# Patient Record
Sex: Male | Born: 1965 | Race: White | Hispanic: No | Marital: Married | Smoking: Former smoker
Health system: Southern US, Community
[De-identification: ages and names within clinical notes are randomized; demographics above are authoritative.]

## PROBLEM LIST (undated history)

## (undated) DIAGNOSIS — F528 Other sexual dysfunction not due to a substance or known physiological condition: Secondary | ICD-10-CM

## (undated) DIAGNOSIS — E785 Hyperlipidemia, unspecified: Secondary | ICD-10-CM

## (undated) DIAGNOSIS — R7302 Impaired glucose tolerance (oral): Secondary | ICD-10-CM

## (undated) DIAGNOSIS — R030 Elevated blood-pressure reading, without diagnosis of hypertension: Secondary | ICD-10-CM

## (undated) DIAGNOSIS — J309 Allergic rhinitis, unspecified: Secondary | ICD-10-CM

## (undated) HISTORY — DX: Other sexual dysfunction not due to a substance or known physiological condition: F52.8

## (undated) HISTORY — DX: Impaired glucose tolerance (oral): R73.02

## (undated) HISTORY — DX: Elevated blood-pressure reading, without diagnosis of hypertension: R03.0

## (undated) HISTORY — DX: Allergic rhinitis, unspecified: J30.9

## (undated) HISTORY — DX: Hyperlipidemia, unspecified: E78.5

---
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
## 2003-09-18 ENCOUNTER — Emergency Department (HOSPITAL_COMMUNITY): Admission: EM | Admit: 2003-09-18 | Discharge: 2003-09-18 | Payer: Self-pay | Admitting: Family Medicine

## 2008-05-07 ENCOUNTER — Encounter (INDEPENDENT_AMBULATORY_CARE_PROVIDER_SITE_OTHER): Payer: Self-pay | Admitting: *Deleted

## 2008-05-26 ENCOUNTER — Ambulatory Visit: Payer: Self-pay | Admitting: Internal Medicine

## 2008-05-26 DIAGNOSIS — J309 Allergic rhinitis, unspecified: Secondary | ICD-10-CM | POA: Insufficient documentation

## 2008-05-26 DIAGNOSIS — F528 Other sexual dysfunction not due to a substance or known physiological condition: Secondary | ICD-10-CM

## 2008-05-26 DIAGNOSIS — R030 Elevated blood-pressure reading, without diagnosis of hypertension: Secondary | ICD-10-CM

## 2008-05-26 DIAGNOSIS — E785 Hyperlipidemia, unspecified: Secondary | ICD-10-CM | POA: Insufficient documentation

## 2008-05-26 HISTORY — DX: Allergic rhinitis, unspecified: J30.9

## 2008-05-26 HISTORY — DX: Other sexual dysfunction not due to a substance or known physiological condition: F52.8

## 2008-05-26 HISTORY — DX: Hyperlipidemia, unspecified: E78.5

## 2008-05-26 HISTORY — DX: Elevated blood-pressure reading, without diagnosis of hypertension: R03.0

## 2009-12-22 ENCOUNTER — Ambulatory Visit: Payer: Self-pay | Admitting: Internal Medicine

## 2010-05-31 ENCOUNTER — Telehealth (INDEPENDENT_AMBULATORY_CARE_PROVIDER_SITE_OTHER): Payer: Self-pay | Admitting: *Deleted

## 2010-12-05 LAB — CONVERTED CEMR LAB
ALT: 75 units/L — ABNORMAL HIGH (ref 0–53)
ALT: 84 units/L — ABNORMAL HIGH (ref 0–53)
AST: 37 units/L (ref 0–37)
AST: 46 units/L — ABNORMAL HIGH (ref 0–37)
Albumin: 4.6 g/dL (ref 3.5–5.2)
Albumin: 4.7 g/dL (ref 3.5–5.2)
Alkaline Phosphatase: 44 units/L (ref 39–117)
Alkaline Phosphatase: 54 units/L (ref 39–117)
BUN: 10 mg/dL (ref 6–23)
BUN: 14 mg/dL (ref 6–23)
Basophils Absolute: 0 10*3/uL (ref 0.0–0.1)
Basophils Absolute: 0.1 10*3/uL (ref 0.0–0.1)
Basophils Relative: 0.3 % (ref 0.0–3.0)
Basophils Relative: 1.2 % (ref 0.0–3.0)
Bilirubin Urine: NEGATIVE
Bilirubin Urine: NEGATIVE
Bilirubin, Direct: 0.1 mg/dL (ref 0.0–0.3)
Bilirubin, Direct: 0.2 mg/dL (ref 0.0–0.3)
CO2: 31 meq/L (ref 19–32)
CO2: 31 meq/L (ref 19–32)
Calcium: 10 mg/dL (ref 8.4–10.5)
Calcium: 9.4 mg/dL (ref 8.4–10.5)
Chloride: 104 meq/L (ref 96–112)
Chloride: 105 meq/L (ref 96–112)
Cholesterol: 248 mg/dL (ref 0–200)
Cholesterol: 248 mg/dL — ABNORMAL HIGH (ref 0–200)
Creatinine, Ser: 1.1 mg/dL (ref 0.4–1.5)
Creatinine, Ser: 1.1 mg/dL (ref 0.4–1.5)
Direct LDL: 148.2 mg/dL
Direct LDL: 164.2 mg/dL
Eosinophils Absolute: 0.4 10*3/uL (ref 0.0–0.7)
Eosinophils Absolute: 0.5 10*3/uL (ref 0.0–0.7)
Eosinophils Relative: 3.6 % (ref 0.0–5.0)
Eosinophils Relative: 5.7 % — ABNORMAL HIGH (ref 0.0–5.0)
GFR calc Af Amer: 95 mL/min
GFR calc non Af Amer: 77.77 mL/min (ref >60)
GFR calc non Af Amer: 78 mL/min
Glucose, Bld: 110 mg/dL — ABNORMAL HIGH (ref 70–99)
Glucose, Bld: 110 mg/dL — ABNORMAL HIGH (ref 70–99)
HCT: 48.4 % (ref 39.0–52.0)
HCT: 48.6 % (ref 39.0–52.0)
HDL: 33.9 mg/dL — ABNORMAL LOW (ref >39.0)
HDL: 41.7 mg/dL (ref >39.00)
Hemoglobin, Urine: NEGATIVE
Hemoglobin, Urine: NEGATIVE
Hemoglobin: 16.8 g/dL (ref 13.0–17.0)
Hemoglobin: 17 g/dL (ref 13.0–17.0)
Ketones, ur: NEGATIVE mg/dL
Ketones, ur: NEGATIVE mg/dL
Leukocytes, UA: NEGATIVE
Leukocytes, UA: NEGATIVE
Lymphocytes Relative: 32.8 % (ref 12.0–46.0)
Lymphocytes Relative: 34.2 % (ref 12.0–46.0)
Lymphs Abs: 3.4 10*3/uL (ref 0.7–4.0)
MCHC: 34.5 g/dL (ref 30.0–36.0)
MCHC: 35 g/dL (ref 30.0–36.0)
MCV: 92.1 fL (ref 78.0–100.0)
MCV: 92.9 fL (ref 78.0–100.0)
Monocytes Absolute: 0.6 10*3/uL (ref 0.1–1.0)
Monocytes Absolute: 0.8 10*3/uL (ref 0.1–1.0)
Monocytes Relative: 7.1 % (ref 3.0–12.0)
Monocytes Relative: 8.1 % (ref 3.0–12.0)
Neutro Abs: 4.4 10*3/uL (ref 1.4–7.7)
Neutro Abs: 5.1 10*3/uL (ref 1.4–7.7)
Neutrophils Relative %: 52.9 % (ref 43.0–77.0)
Neutrophils Relative %: 54.1 % (ref 43.0–77.0)
Nitrite: NEGATIVE
Nitrite: NEGATIVE
PSA: 2.61 ng/mL (ref 0.10–4.00)
PSA: 2.87 ng/mL (ref 0.10–4.00)
Platelets: 241 10*3/uL (ref 150–400)
Platelets: 265 10*3/uL (ref 150.0–400.0)
Potassium: 3.8 meq/L (ref 3.5–5.1)
Potassium: 4.4 meq/L (ref 3.5–5.1)
RBC: 5.21 M/uL (ref 4.22–5.81)
RBC: 5.28 M/uL (ref 4.22–5.81)
RDW: 13.3 % (ref 11.5–14.6)
RDW: 13.9 % (ref 11.5–14.6)
Sodium: 140 meq/L (ref 135–145)
Sodium: 142 meq/L (ref 135–145)
Specific Gravity, Urine: 1.01 (ref 1.000–1.030)
Specific Gravity, Urine: 1.015 (ref 1.000–1.03)
TSH: 1.66 microintl units/mL (ref 0.35–5.50)
TSH: 1.78 microintl units/mL (ref 0.35–5.50)
Testosterone: 282.11 ng/dL — ABNORMAL LOW (ref 350.00–890)
Total Bilirubin: 1 mg/dL (ref 0.3–1.2)
Total Bilirubin: 1.5 mg/dL — ABNORMAL HIGH (ref 0.3–1.2)
Total CHOL/HDL Ratio: 6
Total CHOL/HDL Ratio: 7.3
Total Protein, Urine: NEGATIVE mg/dL
Total Protein: 7.3 g/dL (ref 6.0–8.3)
Total Protein: 7.5 g/dL (ref 6.0–8.3)
Triglycerides: 327 mg/dL (ref 0–149)
Triglycerides: 372 mg/dL — ABNORMAL HIGH (ref 0.0–149.0)
Urine Glucose: NEGATIVE mg/dL
Urine Glucose: NEGATIVE mg/dL
Urobilinogen, UA: 0.2 (ref 0.0–1.0)
Urobilinogen, UA: 0.2 (ref 0.0–1.0)
VLDL: 65 mg/dL — ABNORMAL HIGH (ref 0–40)
VLDL: 74.4 mg/dL — ABNORMAL HIGH (ref 0.0–40.0)
WBC: 8.2 10*3/uL (ref 4.5–10.5)
WBC: 9.8 10*3/uL (ref 4.5–10.5)
pH: 7 (ref 5.0–8.0)
pH: 8.5 (ref 5.0–8.0)

## 2010-12-07 NOTE — Progress Notes (Signed)
  Phone Note Other Incoming   Request: Send information Summary of Call: Request for records received from  the [facility] office. Request forwarded to [facility].

## 2010-12-07 NOTE — Assessment & Plan Note (Signed)
Summary: BP IS HIGH/NWS  #   Vital Signs:  Patient profile:   45 year old male Height:      74 inches Weight:      231 pounds BMI:     29.77 O2 Sat:      97 % on Room air Temp:     98 degrees F oral Pulse rate:   79 / minute BP sitting:   132 / 92  (left arm) Cuff size:   large  Vitals Entered ByMarland Kitchen Zella Ball Ewing (December 22, 2009 9:59 AM)  O2 Flow:  Room air CC: Blood Pressure High/RE   CC:  Blood Pressure High/RE.  History of Present Illness: overall wt prob stable more or less for the last few yrs;  recent tx at urgent care for bonrhcitis with zpack and narcotic cough med, overall better; BP this past wk has been in the 150'2 sbp, and 110-120' for diast, feels overall some better today, BP down this am with persistent midl cough only and low energy and early to bed the the few days;  has BP cuff at work.  Pt denies CP, sob, doe, wheezing, orthopnea, pnd, worsening LE edema, palps, dizziness or syncope .  Pt denies new neuro symptoms such as headache, facial or extremity weakness  Pt denies polydipsia, polyuria, or low sugar symptoms such as shakiness improved with eating.  Overall good compliance with meds, trying to start low chol diet soon with the food; , wt stable, little excercise however   Problems Prior to Update: 1)  Preventive Health Care  (ICD-V70.0) 2)  Elevated Blood Pressure Without Diagnosis of Hypertension  (ICD-796.2) 3)  Erectile Dysfunction  (ICD-302.72) 4)  Preventive Health Care  (ICD-V70.0) 5)  Hyperlipidemia  (ICD-272.4) 6)  Allergic Rhinitis  (ICD-477.9)  Medications Prior to Update: 1)  Viagra 100 Mg  Tabs (Sildenafil Citrate) .Marland Kitchen.. 1 By Mouth Once Daily As Needed  Current Medications (verified): 1)  Viagra 100 Mg  Tabs (Sildenafil Citrate) .Marland Kitchen.. 1 By Mouth Once Daily As Needed  Allergies (verified): No Known Drug Allergies  Past History:  Past Medical History: Last updated: 05/26/2008 Allergic rhinitis E.D. Hyperlipidemia  Past Surgical  History: Last updated: 05/26/2008 s/p facial cyst 1984  Family History: Last updated: 05/26/2008 grandfather iwth MI and stroke at 56 yo father with CAD/CABG at 11 yo, DM  Social History: Last updated: 12/22/2009 moved to [place] and back Married 3 children work - Health visitor for [employer] Current Smoker - 4 cigar per year Alcohol use-yes - social  Risk Factors: Smoking Status: current (05/26/2008)  Family History: Reviewed history from 05/26/2008 and no changes required. grandfather iwth MI and stroke at 59 yo father with CAD/CABG at 69 yo, DM  Social History: Reviewed history from 05/26/2008 and no changes required. 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         all otherwise negative per pt -  Physical Exam  General:  alert and overweight-appearing.   Head:  normocephalic and  atraumatic.   Eyes:  vision grossly intact, pupils equal, and pupils round.   Ears:  R ear normal and L ear normal.   Nose:  no external deformity and no nasal discharge.   Mouth:  no gingival abnormalities and pharynx pink and moist.   Neck:  supple and no masses.   Lungs:  normal respiratory effort and normal breath sounds.   Heart:  normal rate and regular rhythm.   Abdomen:  soft, non-tender, and normal bowel sounds.   Msk:  no joint tenderness and no joint  swelling.   Extremities:  no edema, no erythema  Neurologic:  cranial nerves II-XII intact and strength normal in all extremities.     Impression & Recommendations:  Problem # 1:  Preventive Health Care (ICD-V70.0) Overall doing well, age appropriate education and counseling updated and referral for appropriate preventive services done unless declined, immunizations up to date or declined, diet counseling done if overweight, urged to quit smoking if smokes , most recent labs reviewed and current ordered if appropriate, ecg reviewed or declined (interpretation per ECG scanned in the EMR if done); information regarding Medicare Prevention requirements given if appropriate  Orders: EKG w/ Interpretation (93000) TLB-BMP (Basic Metabolic Panel-BMET) (80048-METABOL) TLB-CBC Platelet - w/Differential (85025-CBCD) TLB-Hepatic/Liver Function Pnl (80076-HEPATIC) TLB-Lipid Panel (80061-LIPID) TLB-TSH (Thyroid Stimulating Hormone) (84443-TSH) TLB-PSA (Prostate Specific Antigen) (84153-PSA) TLB-Udip ONLY (81003-UDIP)  Problem # 2:  ELEVATED BLOOD PRESSURE WITHOUT DIAGNOSIS OF HYPERTENSION (ICD-796.2) to cont to monitor, possible pre-HTN, and recent elev situational related to recent bronchitis; to cont monitor at work and call wtih BP in 3 wks  Complete Medication List: 1)  Viagra 100 Mg Tabs (Sildenafil citrate) .Marland Kitchen.. 1 by mouth once daily as needed  Patient Instructions: 1)  Please go to the Lab in the basement for your blood and/or urine tests today 2)  Your EKG was good today 3)  Continue all previous medications as before this visit  4)  Check your Blood Pressure regularly, such as 1-3 times per day for the next 2 -3 wks.  . If it is above 140/90, you should call with the results. 5)  Please schedule a follow-up appointment in 1 year or sooner if needed

## 2011-02-23 ENCOUNTER — Other Ambulatory Visit: Payer: Self-pay | Admitting: Internal Medicine

## 2011-05-27 ENCOUNTER — Other Ambulatory Visit: Payer: Self-pay | Admitting: Internal Medicine

## 2011-07-02 ENCOUNTER — Other Ambulatory Visit: Payer: Self-pay | Admitting: Internal Medicine

## 2011-07-05 ENCOUNTER — Other Ambulatory Visit (INDEPENDENT_AMBULATORY_CARE_PROVIDER_SITE_OTHER): Payer: BC Managed Care – PPO

## 2011-07-05 ENCOUNTER — Other Ambulatory Visit: Payer: Self-pay | Admitting: Internal Medicine

## 2011-07-05 ENCOUNTER — Telehealth: Payer: Self-pay

## 2011-07-05 DIAGNOSIS — Z1289 Encounter for screening for malignant neoplasm of other sites: Secondary | ICD-10-CM

## 2011-07-05 DIAGNOSIS — Z0000 Encounter for general adult medical examination without abnormal findings: Secondary | ICD-10-CM

## 2011-07-05 LAB — URINALYSIS, ROUTINE W REFLEX MICROSCOPIC
Bilirubin Urine: NEGATIVE
Hgb urine dipstick: NEGATIVE
Nitrite: NEGATIVE
Specific Gravity, Urine: 1.01 (ref 1.000–1.030)
Total Protein, Urine: NEGATIVE
Urine Glucose: NEGATIVE
Urobilinogen, UA: 0.2 (ref 0.0–1.0)
pH: 7 (ref 5.0–8.0)

## 2011-07-05 LAB — BASIC METABOLIC PANEL
BUN: 20 mg/dL (ref 6–23)
CO2: 29 mEq/L (ref 19–32)
Calcium: 9.7 mg/dL (ref 8.4–10.5)
Chloride: 101 mEq/L (ref 96–112)
Creatinine, Ser: 1.2 mg/dL (ref 0.4–1.5)
GFR: 72.63 mL/min (ref >60.00)
Glucose, Bld: 107 mg/dL — ABNORMAL HIGH (ref 70–99)
Potassium: 4.3 mEq/L (ref 3.5–5.1)
Sodium: 139 mEq/L (ref 135–145)

## 2011-07-05 LAB — CBC WITH DIFFERENTIAL/PLATELET
Basophils Absolute: 0 10*3/uL (ref 0.0–0.1)
Basophils Relative: 0.3 % (ref 0.0–3.0)
Eosinophils Absolute: 0.3 10*3/uL (ref 0.0–0.7)
Eosinophils Relative: 2.6 % (ref 0.0–5.0)
HCT: 48.4 % (ref 39.0–52.0)
Hemoglobin: 16.5 g/dL (ref 13.0–17.0)
Lymphocytes Relative: 30.4 % (ref 12.0–46.0)
Lymphs Abs: 3.1 10*3/uL (ref 0.7–4.0)
MCHC: 34 g/dL (ref 30.0–36.0)
MCV: 93.7 fl (ref 78.0–100.0)
Monocytes Absolute: 0.8 10*3/uL (ref 0.1–1.0)
Monocytes Relative: 7.6 % (ref 3.0–12.0)
Neutro Abs: 6.1 10*3/uL (ref 1.4–7.7)
Neutrophils Relative %: 59.1 % (ref 43.0–77.0)
Platelets: 304 10*3/uL (ref 150.0–400.0)
RBC: 5.16 Mil/uL (ref 4.22–5.81)
RDW: 14.3 % (ref 11.5–14.6)
WBC: 10.3 10*3/uL (ref 4.5–10.5)

## 2011-07-05 LAB — HEPATIC FUNCTION PANEL
ALT: 41 U/L (ref 0–53)
AST: 85 U/L — ABNORMAL HIGH (ref 0–37)
Albumin: 4.9 g/dL (ref 3.5–5.2)
Alkaline Phosphatase: 69 U/L (ref 39–117)
Bilirubin, Direct: 0.2 mg/dL (ref 0.0–0.3)
Total Bilirubin: 1.5 mg/dL — ABNORMAL HIGH (ref 0.3–1.2)
Total Protein: 7.4 g/dL (ref 6.0–8.3)

## 2011-07-05 LAB — LIPID PANEL
Cholesterol: 215 mg/dL — ABNORMAL HIGH (ref 0–200)
HDL: 44.8 mg/dL (ref >39.00)
Total CHOL/HDL Ratio: 5
Triglycerides: 229 mg/dL — ABNORMAL HIGH (ref 0.0–149.0)
VLDL: 45.8 mg/dL — ABNORMAL HIGH (ref 0.0–40.0)

## 2011-07-05 LAB — PSA: PSA: 2.89 ng/mL (ref 0.10–4.00)

## 2011-07-05 LAB — TSH: TSH: 1.75 u[IU]/mL (ref 0.35–5.50)

## 2011-07-05 NOTE — Telephone Encounter (Signed)
Put lab order in for this patients physical

## 2011-07-06 ENCOUNTER — Ambulatory Visit: Payer: BC Managed Care – PPO

## 2011-07-06 LAB — LDL CHOLESTEROL, DIRECT: Direct LDL: 140 mg/dL

## 2011-07-07 LAB — HEPATITIS PANEL, ACUTE
HCV Ab: NEGATIVE
Hep A IgM: NEGATIVE
Hep B C IgM: NEGATIVE
Hepatitis B Surface Ag: NEGATIVE

## 2011-07-08 ENCOUNTER — Other Ambulatory Visit: Payer: Self-pay | Admitting: Internal Medicine

## 2011-07-10 ENCOUNTER — Encounter: Payer: Self-pay | Admitting: Internal Medicine

## 2011-07-10 DIAGNOSIS — Z0000 Encounter for general adult medical examination without abnormal findings: Secondary | ICD-10-CM | POA: Insufficient documentation

## 2011-07-12 ENCOUNTER — Encounter: Payer: Self-pay | Admitting: Internal Medicine

## 2011-07-12 ENCOUNTER — Ambulatory Visit (INDEPENDENT_AMBULATORY_CARE_PROVIDER_SITE_OTHER): Payer: BC Managed Care – PPO | Admitting: Internal Medicine

## 2011-07-12 VITALS — BP 120/82 | HR 68 | Temp 98.5°F | Ht 73.0 in | Wt 219.5 lb

## 2011-07-12 DIAGNOSIS — Z23 Encounter for immunization: Secondary | ICD-10-CM

## 2011-07-12 DIAGNOSIS — Z0000 Encounter for general adult medical examination without abnormal findings: Secondary | ICD-10-CM

## 2011-07-12 DIAGNOSIS — E785 Hyperlipidemia, unspecified: Secondary | ICD-10-CM

## 2011-07-12 MED ORDER — SILDENAFIL CITRATE 100 MG PO TABS: 100.0000 mg | ORAL_TABLET | Freq: Every day | ORAL | Status: DC | PRN

## 2011-07-12 MED ORDER — TETANUS-DIPHTH-ACELL PERTUSSIS 5-2.5-18.5 LF-MCG/0.5 IM SUSP: 0.5000 mL | Freq: Once | INTRAMUSCULAR | Status: DC

## 2011-07-12 NOTE — Patient Instructions (Signed)
Continue all other medications as before; refill was sent to the pharmacy Please continue your excellent attention to better diet and exercise You had the tetanus update today (due next in 10 yrs) Please return in 1 year for your yearly visit, or sooner if needed, with Lab testing done 3-5 days before

## 2011-07-12 NOTE — Progress Notes (Signed)
Subjective:    Patient ID: Steven Mcclain, male    DOB: [birth year], 45 y.o.   MRN: [id number]  HPI  Here for wellness and f/u;  Overall doing ok;  Pt denies CP, worsening SOB, DOE, wheezing, orthopnea, PND, worsening LE edema, palpitations, dizziness or syncope.  Pt denies neurological change such as new Headache, facial or extremity weakness.  Pt denies polydipsia, polyuria, or low sugar symptoms. Pt states overall good compliance with treatment and medications, good tolerability, and trying to follow lower cholesterol diet.  Pt denies worsening depressive symptoms, suicidal ideation or panic. No fever, wt loss, night sweats, loss of appetite, or other constitutional symptoms.  Pt states good ability with ADL's, low fall risk, home safety reviewed and adequate, no significant changes in hearing or vision, and more active with exercise.  Has lost overall about 25 lbs per pt in the past yr. Past Medical History  Diagnosis Date  . ALLERGIC RHINITIS 05/26/2008  . ELEVATED BLOOD PRESSURE WITHOUT DIAGNOSIS OF HYPERTENSION 05/26/2008  . ERECTILE DYSFUNCTION 05/26/2008  . HYPERLIPIDEMIA 05/26/2008   Past Surgical History  Procedure Date  . S/p facial cyst 1984    reports that he has been smoking Cigars.  He does not have any smokeless tobacco history on file. He reports that he drinks alcohol. His drug history not on file. family history includes Coronary artery disease in his father and Diabetes in his father. No Known Allergies Current Outpatient Prescriptions on File Prior to Visit  Medication Sig Dispense Refill  . VIAGRA 100 MG tablet TAKE 1 TABLET EVERY DAY AS NEEDED  5 tablet  0   No current facility-administered medications on file prior to visit.   Review of Systems Review of Systems  Constitutional: Negative for diaphoresis, activity change, appetite change and unexpected weight change.  HENT: Negative for hearing loss, ear pain, facial swelling, mouth sores and neck stiffness.   Eyes:  Negative for pain, redness and visual disturbance.  Respiratory: Negative for shortness of breath and wheezing.   Cardiovascular: Negative for chest pain and palpitations.  Gastrointestinal: Negative for diarrhea, blood in stool, abdominal distention and rectal pain.  Genitourinary: Negative for hematuria, flank pain and decreased urine volume.  Musculoskeletal: Negative for myalgias and joint swelling.  Skin: Negative for color change and wound.  Neurological: Negative for syncope and numbness.  Hematological: Negative for adenopathy.  Psychiatric/Behavioral: Negative for hallucinations, self-injury, decreased concentration and agitation.      Objective:   Physical Exam BP 120/82  Pulse 68  Temp(Src) 98.5 F (36.9 C) (Oral)  Ht 6\' 1"  (1.854 m)  Wt 219 lb 8 oz (99.565 kg)  BMI 28.96 kg/m2  SpO2 97% Physical Exam  VS noted Constitutional: Pt is oriented to person, place, and time. Appears well-developed and well-nourished.  HENT:  Head: Normocephalic and atraumatic.  Right Ear: External ear normal.  Left Ear: External ear normal.  Nose: Nose normal.  Mouth/Throat: Oropharynx is clear and moist.  Eyes: Conjunctivae and EOM are normal. Pupils are equal, round, and reactive to light.  Neck: Normal range of motion. Neck supple. No JVD present. No tracheal deviation present.  Cardiovascular: Normal rate, regular rhythm, normal heart sounds and intact distal pulses.   Pulmonary/Chest: Effort normal and breath sounds normal.  Abdominal: Soft. Bowel sounds are normal. There is no tenderness.  Musculoskeletal: Normal range of motion. Exhibits no edema.  Lymphadenopathy:  Has no cervical adenopathy.  Neurological: Pt is alert and oriented to person, place, and time. Pt has  normal reflexes. No cranial nerve deficit.  Skin: Skin is warm and dry. No rash noted.  Psychiatric:  Has  normal mood and affect. Behavior is normal.     Assessment & Plan:

## 2011-07-12 NOTE — Assessment & Plan Note (Signed)
LDL 140  Nice improved in the past  2 yrs with diet , exercise;  For cont'd diet for now, consider statin next yr if not improved

## 2011-07-12 NOTE — Assessment & Plan Note (Signed)

## 2011-12-04 ENCOUNTER — Ambulatory Visit (INDEPENDENT_AMBULATORY_CARE_PROVIDER_SITE_OTHER): Payer: BC Managed Care – PPO

## 2011-12-04 DIAGNOSIS — J019 Acute sinusitis, unspecified: Secondary | ICD-10-CM

## 2012-07-06 ENCOUNTER — Other Ambulatory Visit (INDEPENDENT_AMBULATORY_CARE_PROVIDER_SITE_OTHER): Payer: BC Managed Care – PPO

## 2012-07-06 DIAGNOSIS — Z0000 Encounter for general adult medical examination without abnormal findings: Secondary | ICD-10-CM

## 2012-07-06 LAB — HEPATIC FUNCTION PANEL
ALT: 34 U/L (ref 0–53)
AST: 23 U/L (ref 0–37)
Albumin: 4.5 g/dL (ref 3.5–5.2)
Alkaline Phosphatase: 49 U/L (ref 39–117)
Bilirubin, Direct: 0.2 mg/dL (ref 0.0–0.3)
Total Bilirubin: 1 mg/dL (ref 0.3–1.2)
Total Protein: 7.3 g/dL (ref 6.0–8.3)

## 2012-07-06 LAB — BASIC METABOLIC PANEL
BUN: 15 mg/dL (ref 6–23)
CO2: 30 mEq/L (ref 19–32)
Calcium: 9.8 mg/dL (ref 8.4–10.5)
Chloride: 103 mEq/L (ref 96–112)
Creatinine, Ser: 1.2 mg/dL (ref 0.4–1.5)
GFR: 71.58 mL/min (ref >60.00)
Glucose, Bld: 101 mg/dL — ABNORMAL HIGH (ref 70–99)
Potassium: 4.5 mEq/L (ref 3.5–5.1)
Sodium: 139 mEq/L (ref 135–145)

## 2012-07-06 LAB — URINALYSIS, ROUTINE W REFLEX MICROSCOPIC
Bilirubin Urine: NEGATIVE
Hgb urine dipstick: NEGATIVE
Ketones, ur: NEGATIVE
Leukocytes, UA: NEGATIVE
Nitrite: NEGATIVE
Specific Gravity, Urine: 1.01 (ref 1.000–1.030)
Total Protein, Urine: NEGATIVE
Urine Glucose: NEGATIVE
Urobilinogen, UA: 0.2 (ref 0.0–1.0)
pH: 7 (ref 5.0–8.0)

## 2012-07-06 LAB — CBC WITH DIFFERENTIAL/PLATELET
Basophils Absolute: 0 10*3/uL (ref 0.0–0.1)
Basophils Relative: 0.1 % (ref 0.0–3.0)
Eosinophils Absolute: 0.5 10*3/uL (ref 0.0–0.7)
Eosinophils Relative: 5.3 % — ABNORMAL HIGH (ref 0.0–5.0)
HCT: 47.2 % (ref 39.0–52.0)
Hemoglobin: 16 g/dL (ref 13.0–17.0)
Lymphocytes Relative: 35.3 % (ref 12.0–46.0)
Lymphs Abs: 3.3 10*3/uL (ref 0.7–4.0)
MCHC: 33.9 g/dL (ref 30.0–36.0)
MCV: 92.8 fl (ref 78.0–100.0)
Monocytes Absolute: 0.8 10*3/uL (ref 0.1–1.0)
Monocytes Relative: 8.1 % (ref 3.0–12.0)
Neutro Abs: 4.8 10*3/uL (ref 1.4–7.7)
Neutrophils Relative %: 51.2 % (ref 43.0–77.0)
Platelets: 257 10*3/uL (ref 150.0–400.0)
RBC: 5.08 Mil/uL (ref 4.22–5.81)
RDW: 13.9 % (ref 11.5–14.6)
WBC: 9.3 10*3/uL (ref 4.5–10.5)

## 2012-07-06 LAB — LIPID PANEL
Cholesterol: 231 mg/dL — ABNORMAL HIGH (ref 0–200)
HDL: 39.5 mg/dL (ref >39.00)
Total CHOL/HDL Ratio: 6
Triglycerides: 273 mg/dL — ABNORMAL HIGH (ref 0.0–149.0)
VLDL: 54.6 mg/dL — ABNORMAL HIGH (ref 0.0–40.0)

## 2012-07-06 LAB — LDL CHOLESTEROL, DIRECT: Direct LDL: 142.6 mg/dL

## 2012-07-06 LAB — PSA: PSA: 2.21 ng/mL (ref 0.10–4.00)

## 2012-07-06 LAB — TSH: TSH: 1.67 u[IU]/mL (ref 0.35–5.50)

## 2012-07-12 ENCOUNTER — Ambulatory Visit (INDEPENDENT_AMBULATORY_CARE_PROVIDER_SITE_OTHER): Payer: BC Managed Care – PPO | Admitting: Internal Medicine

## 2012-07-12 ENCOUNTER — Encounter: Payer: Self-pay | Admitting: Internal Medicine

## 2012-07-12 VITALS — BP 128/88 | HR 80 | Temp 99.1°F | Resp 16 | Wt 224.5 lb

## 2012-07-12 DIAGNOSIS — E785 Hyperlipidemia, unspecified: Secondary | ICD-10-CM

## 2012-07-12 DIAGNOSIS — Z136 Encounter for screening for cardiovascular disorders: Secondary | ICD-10-CM

## 2012-07-12 DIAGNOSIS — Z0000 Encounter for general adult medical examination without abnormal findings: Secondary | ICD-10-CM

## 2012-07-12 MED ORDER — ASPIRIN 81 MG PO TBEC: 81.0000 mg | DELAYED_RELEASE_TABLET | Freq: Every day | ORAL | Status: AC

## 2012-07-12 MED ORDER — SILDENAFIL CITRATE 100 MG PO TABS: 100.0000 mg | ORAL_TABLET | ORAL | Status: DC | PRN

## 2012-07-12 NOTE — Patient Instructions (Addendum)
Continue all other medications as before Please start Aspirin 81 mg -1 per day - Enteric Coated only Please continue your efforts at being more active, low cholesterol diet, and weight control. You are otherwise up to date with prevention Please return in 1 year for your yearly visit, or sooner if needed, with Lab testing done 3-5 days before

## 2012-07-12 NOTE — Progress Notes (Signed)
Subjective:    Patient ID: Steven Mcclain, male    DOB: February 19, 1966, 46 y.o.   MRN: [id number]  HPI Here for wellness and f/u;  Overall doing ok;  Pt denies CP, worsening SOB, DOE, wheezing, orthopnea, PND, worsening LE edema, palpitations, dizziness or syncope.  Pt denies neurological change such as new Headache, facial or extremity weakness.  Pt denies polydipsia, polyuria, or low sugar symptoms. Pt states overall good compliance with treatment and medications, good tolerability, and trying to follow lower cholesterol diet.  Pt denies worsening depressive symptoms, suicidal ideation or panic. No fever, wt loss, night sweats, loss of appetite, or other constitutional symptoms.  Pt states good ability with ADL's, low fall risk, home safety reviewed and adequate, no significant changes in hearing or vision, and occasionally active with exercise.  No acute complaints.  Needs viagra refill.  Has been somewhat less active and diet not as good in the past 6 months as he has been traveling more for work. Past Medical History  Diagnosis Date  . ALLERGIC RHINITIS 05/26/2008  . ELEVATED BLOOD PRESSURE WITHOUT DIAGNOSIS OF HYPERTENSION 05/26/2008  . ERECTILE DYSFUNCTION 05/26/2008  . HYPERLIPIDEMIA 05/26/2008   Past Surgical History  Procedure Date  . S/p facial cyst 1984    reports that he has been smoking Cigars.  He does not have any smokeless tobacco history on file. He reports that he drinks alcohol. His drug history not on file. family history includes Coronary artery disease in his father and Diabetes in his father. No Known Allergies Current Outpatient Prescriptions on File Prior to Visit  Medication Sig Dispense Refill  . DISCONTD: VIAGRA 100 MG tablet TAKE 1 TABLET EVERY DAY AS NEEDED  5 tablet  0  . sildenafil (VIAGRA) 100 MG tablet Take 1 tablet (100 mg total) by mouth daily as needed for erectile dysfunction.  10 tablet  11   Current Facility-Administered Medications on File Prior to Visit    Medication Dose Route Frequency Provider Last Rate Last Dose  . DISCONTD: TDaP (BOOSTRIX) injection 0.5 mL  0.5 mL Intramuscular Once Corwin Levins, MD       Review of Systems Review of Systems  Constitutional: Negative for diaphoresis, activity change, appetite change and unexpected weight change.  HENT: Negative for hearing loss, ear pain, facial swelling, mouth sores and neck stiffness.   Eyes: Negative for pain, redness and visual disturbance.  Respiratory: Negative for shortness of breath and wheezing.   Cardiovascular: Negative for chest pain and palpitations.  Gastrointestinal: Negative for diarrhea, blood in stool, abdominal distention and rectal pain.  Genitourinary: Negative for hematuria, flank pain and decreased urine volume.  Musculoskeletal: Negative for myalgias and joint swelling.  Skin: Negative for color change and wound.  Neurological: Negative for syncope and numbness.  Hematological: Negative for adenopathy.  Psychiatric/Behavioral: Negative for hallucinations, self-injury, decreased concentration and agitation.      Objective:   Physical Exam BP 128/88  Pulse 80  Temp 99.1 F (37.3 C) (Oral)  Resp 16  Wt 224 lb 8 oz (101.833 kg)  SpO2 95% Physical Exam  VS noted Constitutional: Pt is oriented to person, place, and time. Appears well-developed and well-nourished.  HENT:  Head: Normocephalic and atraumatic.  Right Ear: External ear normal.  Left Ear: External ear normal.  Nose: Nose normal.  Mouth/Throat: Oropharynx is clear and moist.  Eyes: Conjunctivae and EOM are normal. Pupils are equal, round, and reactive to light.  Neck: Normal range of motion. Neck supple.  No JVD present. No tracheal deviation present.  Cardiovascular: Normal rate, regular rhythm, normal heart sounds and intact distal pulses.   Pulmonary/Chest: Effort normal and breath sounds normal.  Abdominal: Soft. Bowel sounds are normal. There is no tenderness.  Musculoskeletal: Normal range  of motion. Exhibits no edema.  Lymphadenopathy:  Has no cervical adenopathy.  Neurological: Pt is alert and oriented to person, place, and time. Pt has normal reflexes. No cranial nerve deficit.  Skin: Skin is warm and dry. No rash noted.  Psychiatric:  Has  normal mood and affect. Behavior is normal.     Assessment & Plan:

## 2012-07-12 NOTE — Assessment & Plan Note (Signed)
D/w pt - declines statin at this time, goal ldl < 100

## 2012-07-12 NOTE — Assessment & Plan Note (Addendum)
Overall doing well, age appropriate education and counseling updated, referrals for preventative services and immunizations addressed, dietary and smoking counseling addressed, most recent labs and ECG reviewed.  I have personally reviewed and have noted: 1) the patient's medical and social history 2) The pt's use of alcohol, tobacco, and illicit drugs 3) The patient's current medications and supplements 4) Functional ability including ADL's, fall risk, home safety risk, hearing and visual impairment 5) Diet and physical activities 6) Evidence for depression or mood disorder 7) The patient's height, weight, and BMI have been recorded in the chart I have made referrals, and provided counseling and education based on review of the above ECG reviewed as per emr, to start asa 81 mg in light of his persistent elev LDL

## 2013-07-16 ENCOUNTER — Encounter: Payer: BC Managed Care – PPO | Admitting: Internal Medicine

## 2013-07-16 DIAGNOSIS — Z0289 Encounter for other administrative examinations: Secondary | ICD-10-CM

## 2014-01-03 ENCOUNTER — Telehealth: Payer: Self-pay

## 2014-01-03 MED ORDER — SILDENAFIL CITRATE 100 MG PO TABS: 100.0000 mg | ORAL_TABLET | ORAL | Status: DC | PRN

## 2014-01-03 NOTE — Telephone Encounter (Signed)
The patient called and is hoping to get refills of his viagra rx until his cpe in June.  Pharmacy- [facility] on [address]

## 2014-04-10 ENCOUNTER — Encounter: Payer: BC Managed Care – PPO | Admitting: Internal Medicine

## 2014-07-01 ENCOUNTER — Encounter: Payer: BC Managed Care – PPO | Admitting: Internal Medicine

## 2014-07-28 ENCOUNTER — Telehealth: Payer: Self-pay | Admitting: Internal Medicine

## 2014-07-28 ENCOUNTER — Other Ambulatory Visit: Payer: BC Managed Care – PPO

## 2014-07-28 DIAGNOSIS — Z125 Encounter for screening for malignant neoplasm of prostate: Secondary | ICD-10-CM

## 2014-07-28 DIAGNOSIS — Z0000 Encounter for general adult medical examination without abnormal findings: Secondary | ICD-10-CM

## 2014-07-28 NOTE — Telephone Encounter (Signed)
Pt has [employer] entered standard cpx labs../lmb 

## 2014-07-28 NOTE — Telephone Encounter (Signed)
Pt came by requesting lab orders to be put in.  Pt has CPE this Fri Sept 25 @ 4:30 pm.  Please advise.

## 2014-07-29 ENCOUNTER — Other Ambulatory Visit (INDEPENDENT_AMBULATORY_CARE_PROVIDER_SITE_OTHER): Payer: BC Managed Care – PPO

## 2014-07-29 DIAGNOSIS — R7989 Other specified abnormal findings of blood chemistry: Secondary | ICD-10-CM

## 2014-07-29 DIAGNOSIS — Z0000 Encounter for general adult medical examination without abnormal findings: Secondary | ICD-10-CM

## 2014-07-29 DIAGNOSIS — Z125 Encounter for screening for malignant neoplasm of prostate: Secondary | ICD-10-CM

## 2014-07-29 LAB — CBC WITH DIFFERENTIAL/PLATELET
Basophils Absolute: 0 10*3/uL (ref 0.0–0.1)
Basophils Relative: 0.4 % (ref 0.0–3.0)
EOS PCT: 6 % — AB (ref 0.0–5.0)
Eosinophils Absolute: 0.5 10*3/uL (ref 0.0–0.7)
HCT: 50.1 % (ref 39.0–52.0)
Hemoglobin: 16.9 g/dL (ref 13.0–17.0)
LYMPHS ABS: 3.5 10*3/uL (ref 0.7–4.0)
Lymphocytes Relative: 38.9 % (ref 12.0–46.0)
MCHC: 33.6 g/dL (ref 30.0–36.0)
MCV: 96.9 fl (ref 78.0–100.0)
MONOS PCT: 8.5 % (ref 3.0–12.0)
Monocytes Absolute: 0.8 10*3/uL (ref 0.1–1.0)
Neutro Abs: 4.1 10*3/uL (ref 1.4–7.7)
Neutrophils Relative %: 46.2 % (ref 43.0–77.0)
PLATELETS: 288 10*3/uL (ref 150.0–400.0)
RBC: 5.17 Mil/uL (ref 4.22–5.81)
RDW: 14.9 % (ref 11.5–15.5)
WBC: 8.9 10*3/uL (ref 4.0–10.5)

## 2014-07-29 LAB — URINALYSIS, ROUTINE W REFLEX MICROSCOPIC
BILIRUBIN URINE: NEGATIVE
Hgb urine dipstick: NEGATIVE
KETONES UR: NEGATIVE
LEUKOCYTES UA: NEGATIVE
Nitrite: NEGATIVE
RBC / HPF: NONE SEEN (ref 0–?)
Specific Gravity, Urine: 1.015 (ref 1.000–1.030)
Total Protein, Urine: NEGATIVE
UROBILINOGEN UA: 0.2 (ref 0.0–1.0)
Urine Glucose: NEGATIVE
WBC, UA: NONE SEEN (ref 0–?)
pH: 7 (ref 5.0–8.0)

## 2014-07-29 LAB — HEPATIC FUNCTION PANEL
ALBUMIN: 4.7 g/dL (ref 3.5–5.2)
ALK PHOS: 59 U/L (ref 39–117)
ALT: 83 U/L — ABNORMAL HIGH (ref 0–53)
AST: 47 U/L — AB (ref 0–37)
Bilirubin, Direct: 0.2 mg/dL (ref 0.0–0.3)
Total Bilirubin: 1.2 mg/dL (ref 0.2–1.2)
Total Protein: 7.5 g/dL (ref 6.0–8.3)

## 2014-07-29 LAB — LIPID PANEL
Cholesterol: 261 mg/dL — ABNORMAL HIGH (ref 0–200)
HDL: 33.6 mg/dL — AB (ref >39.00)
NonHDL: 227.4
Total CHOL/HDL Ratio: 8
Triglycerides: 380 mg/dL — ABNORMAL HIGH (ref 0.0–149.0)
VLDL: 76 mg/dL — ABNORMAL HIGH (ref 0.0–40.0)

## 2014-07-29 LAB — BASIC METABOLIC PANEL
BUN: 12 mg/dL (ref 6–23)
CALCIUM: 10.1 mg/dL (ref 8.4–10.5)
CO2: 27 mEq/L (ref 19–32)
Chloride: 102 mEq/L (ref 96–112)
Creatinine, Ser: 1.2 mg/dL (ref 0.4–1.5)
GFR: 70.24 mL/min (ref >60.00)
GLUCOSE: 122 mg/dL — AB (ref 70–99)
POTASSIUM: 4.4 meq/L (ref 3.5–5.1)
SODIUM: 136 meq/L (ref 135–145)

## 2014-07-29 LAB — LDL CHOLESTEROL, DIRECT: Direct LDL: 180.8 mg/dL

## 2014-07-29 LAB — TSH: TSH: 2.73 u[IU]/mL (ref 0.35–4.50)

## 2014-07-29 LAB — PSA: PSA: 3.37 ng/mL (ref 0.10–4.00)

## 2014-07-30 ENCOUNTER — Ambulatory Visit: Payer: BC Managed Care – PPO

## 2014-07-30 DIAGNOSIS — R7309 Other abnormal glucose: Secondary | ICD-10-CM

## 2014-07-30 LAB — HEMOGLOBIN A1C: Hgb A1c MFr Bld: 5.6 % (ref 4.6–6.5)

## 2014-08-01 ENCOUNTER — Ambulatory Visit (INDEPENDENT_AMBULATORY_CARE_PROVIDER_SITE_OTHER): Payer: BC Managed Care – PPO | Admitting: Internal Medicine

## 2014-08-01 ENCOUNTER — Encounter: Payer: Self-pay | Admitting: Internal Medicine

## 2014-08-01 VITALS — BP 150/90 | HR 97 | Temp 98.7°F | Ht 73.0 in | Wt 234.0 lb

## 2014-08-01 DIAGNOSIS — R972 Elevated prostate specific antigen [PSA]: Secondary | ICD-10-CM

## 2014-08-01 DIAGNOSIS — E785 Hyperlipidemia, unspecified: Secondary | ICD-10-CM

## 2014-08-01 DIAGNOSIS — R030 Elevated blood-pressure reading, without diagnosis of hypertension: Secondary | ICD-10-CM | POA: Insufficient documentation

## 2014-08-01 DIAGNOSIS — Z0000 Encounter for general adult medical examination without abnormal findings: Secondary | ICD-10-CM

## 2014-08-01 DIAGNOSIS — R739 Hyperglycemia, unspecified: Secondary | ICD-10-CM | POA: Insufficient documentation

## 2014-08-01 DIAGNOSIS — R7302 Impaired glucose tolerance (oral): Secondary | ICD-10-CM

## 2014-08-01 DIAGNOSIS — R7309 Other abnormal glucose: Secondary | ICD-10-CM

## 2014-08-01 HISTORY — DX: Impaired glucose tolerance (oral): R73.02

## 2014-08-01 MED ORDER — SILDENAFIL CITRATE 100 MG PO TABS: 100.0000 mg | ORAL_TABLET | Freq: Every day | ORAL | Status: DC | PRN

## 2014-08-01 MED ORDER — DOXYCYCLINE HYCLATE 100 MG PO TABS: 100.0000 mg | ORAL_TABLET | Freq: Two times a day (BID) | ORAL | Status: DC

## 2014-08-01 MED ORDER — ATORVASTATIN CALCIUM 20 MG PO TABS: 20.0000 mg | ORAL_TABLET | Freq: Every day | ORAL | Status: DC

## 2014-08-01 NOTE — Assessment & Plan Note (Signed)
Asympt, for a1c next visit

## 2014-08-01 NOTE — Progress Notes (Signed)
Pre visit review using our clinic review tool, if applicable. No additional management support is needed unless otherwise documented below in the visit note. 

## 2014-08-01 NOTE — Assessment & Plan Note (Signed)

## 2014-08-01 NOTE — Assessment & Plan Note (Signed)
For antibx x 1 mo, with f/u psa

## 2014-08-01 NOTE — Assessment & Plan Note (Signed)
?   Essential HTN with wt gain - for further BP check at home and next visit, cont further wt loss

## 2014-08-01 NOTE — Assessment & Plan Note (Signed)
Uncontrolled, goal LDL < 100 - for lipitor 20 qd, lower chol diet

## 2014-08-01 NOTE — Progress Notes (Signed)
Subjective:    Patient ID: Steven Mcclain, male    DOB: 03-22-1966, 48 y.o.   MRN: [id number]  HPI  Here for wellness and f/u;  Overall doing ok;  Pt denies CP, worsening SOB, DOE, wheezing, orthopnea, PND, worsening LE edema, palpitations, dizziness or syncope.  Pt denies neurological change such as new headache, facial or extremity weakness.  Pt denies polydipsia, polyuria, or low sugar symptoms. Pt states overall good compliance with treatment and medications, good tolerability, and has been trying to follow lower cholesterol diet.  Pt denies worsening depressive symptoms, suicidal ideation or panic. No fever, night sweats, wt loss, loss of appetite, or other constitutional symptoms.  Pt states good ability with ADL's, has low fall risk, home safety reviewed and adequate, no other significant changes in hearing or vision, and only occasionally active with exercise.  Not checked BP at home or elsewhere recently.  Has been less active and less rigorous diet over the past yr. Wt overall increased 10 lbs from last yr.   Past Medical History  Diagnosis Date  . ALLERGIC RHINITIS 05/26/2008  . ELEVATED BLOOD PRESSURE WITHOUT DIAGNOSIS OF HYPERTENSION 05/26/2008  . ERECTILE DYSFUNCTION 05/26/2008  . HYPERLIPIDEMIA 05/26/2008  . Impaired glucose tolerance 08/01/2014   Past Surgical History  Procedure Laterality Date  . S/p facial cyst  1984    reports that he has been smoking Cigars.  He does not have any smokeless tobacco history on file. He reports that he drinks alcohol. His drug history is not on file. family history includes Coronary artery disease in his father; Diabetes in his father. No Known Allergies No current outpatient prescriptions on file prior to visit.   No current facility-administered medications on file prior to visit.   Review of Systems Constitutional: Negative for increased diaphoresis, other activity, appetite or other siginficant weight change  HENT: Negative for  worsening hearing loss, ear pain, facial swelling, mouth sores and neck stiffness.   Eyes: Negative for other worsening pain, redness or visual disturbance.  Respiratory: Negative for shortness of breath and wheezing.   Cardiovascular: Negative for chest pain and palpitations.  Gastrointestinal: Negative for diarrhea, blood in stool, abdominal distention or other pain Genitourinary: Negative for hematuria, flank pain or change in urine volume.  Musculoskeletal: Negative for myalgias or other joint complaints.  Skin: Negative for color change and wound.  Neurological: Negative for syncope and numbness. other than noted Hematological: Negative for adenopathy. or other swelling Psychiatric/Behavioral: Negative for hallucinations, self-injury, decreased concentration or other worsening agitation.      Objective:   Physical Exam BP 150/90  Pulse 97  Temp(Src) 98.7 F (37.1 C) (Oral)  Ht  (1.854 m)  Wt 234 lb (106.142 kg)  BMI 30.88 kg/m2  SpO2 96% VS noted,  Constitutional: Pt is oriented to person, place, and time. Appears well-developed and well-nourished.  Head: Normocephalic and atraumatic.  Right Ear: External ear normal.  Left Ear: External ear normal.  Nose: Nose normal.  Mouth/Throat: Oropharynx is clear and moist.  Eyes: Conjunctivae and EOM are normal. Pupils are equal, round, and reactive to light.  Neck: Normal range of motion. Neck supple. No JVD present. No tracheal deviation present.  Cardiovascular: Normal rate, regular rhythm, normal heart sounds and intact distal pulses.   Pulmonary/Chest: Effort normal and breath sounds without rales or wheezing  Abdominal: Soft. Bowel sounds are normal. NT. No HSM  Musculoskeletal: Normal range of motion. Exhibits no edema.  Lymphadenopathy:  Has no cervical  adenopathy.  Neurological: Pt is alert and oriented to person, place, and time. Pt has normal reflexes. No cranial nerve deficit. Motor grossly intact Skin: Skin is warm  and dry. No rash noted.  Psychiatric:  Has normal mood and affect. Behavior is normal.   Wt Readings from Last 3 Encounters:  08/01/14 234 lb (106.142 kg)  07/12/12 224 lb 8 oz (101.833 kg)  07/12/11 219 lb 8 oz (99.565 kg)   BP Readings from Last 3 Encounters:  08/01/14 150/90  07/12/12 128/88  07/12/11 120/82        Assessment & Plan:

## 2014-08-01 NOTE — Patient Instructions (Signed)
Please take all new medication as prescribed - the generic Lipitor, as well as the antibiotic  Please continue all other medications as before, and refills have been done if requested.  Please have the pharmacy call with any other refills you may need.  Please continue your efforts at being more active, low cholesterol diet, and weight control.  You are otherwise up to date with prevention measures today.  Please keep your appointments with your specialists as you may have planned  Please return in 6 months, or sooner if needed, with Lab testing done 3-5 days before

## 2014-08-04 ENCOUNTER — Telehealth: Payer: Self-pay | Admitting: Internal Medicine

## 2014-08-04 NOTE — Telephone Encounter (Signed)
emmi mailed  °

## 2015-02-04 ENCOUNTER — Ambulatory Visit: Payer: BC Managed Care – PPO | Admitting: Internal Medicine

## 2015-02-10 ENCOUNTER — Ambulatory Visit: Payer: Self-pay | Admitting: Internal Medicine

## 2015-03-05 ENCOUNTER — Ambulatory Visit (INDEPENDENT_AMBULATORY_CARE_PROVIDER_SITE_OTHER): Payer: 59 | Admitting: Physician Assistant

## 2015-03-05 VITALS — BP 140/80 | HR 97 | Temp 98.4°F | Resp 16 | Ht 74.0 in | Wt 235.4 lb

## 2015-03-05 DIAGNOSIS — B9789 Other viral agents as the cause of diseases classified elsewhere: Principal | ICD-10-CM

## 2015-03-05 DIAGNOSIS — J069 Acute upper respiratory infection, unspecified: Secondary | ICD-10-CM

## 2015-03-05 MED ORDER — MAGIC MOUTHWASH W/LIDOCAINE: 10.0000 mL | ORAL | Status: DC | PRN

## 2015-03-05 MED ORDER — GUAIFENESIN ER 1200 MG PO TB12: 1.0000 | ORAL_TABLET | Freq: Two times a day (BID) | ORAL | Status: DC | PRN

## 2015-03-05 MED ORDER — HYDROCOD POLST-CPM POLST ER 10-8 MG/5ML PO SUER: 5.0000 mL | Freq: Two times a day (BID) | ORAL | Status: DC | PRN

## 2015-03-05 NOTE — Patient Instructions (Signed)
Take mucinex twice a day. Drink 64 oz water with this daily. May gargle mouthwash every 2-3 hours for throat pain. Do not swallow. Cough syrup at night for sleep. If not getting better in 7-10 days, return for further evaluation.

## 2015-03-05 NOTE — Progress Notes (Signed)
Subjective:    Patient ID: Steven Mcclain, male    DOB: January 15, 1966, 49 y.o.   MRN: [id number]  HPI  This is a 49 year old male who is presenting with cough, headache and sore throat x 2 days. Cough is minimally productive. Headache is located to temporal area. States when he coughs "his throat feels like it is on fire". Took nyquil and helped him sleep for a few hours but then was awake most of the night coughing last night. Denies fever, chills, nasal congestion, SOB, wheezing. No history of lung disease and not a smoker.   Review of Systems  Constitutional: Negative for fever and chills.  HENT: Positive for sneezing. Negative for congestion, ear pain and sinus pressure.   Eyes: Negative for redness.  Respiratory: Positive for cough. Negative for shortness of breath and wheezing.   Gastrointestinal: Negative for nausea, vomiting and abdominal pain.  Skin: Negative for rash.  Allergic/Immunologic: Positive for environmental allergies.  Neurological: Positive for headaches.  Hematological: Negative for adenopathy.  Psychiatric/Behavioral: Positive for sleep disturbance.    Patient Active Problem List   Diagnosis Date Noted  . Impaired glucose tolerance 08/01/2014  . Increased prostate specific antigen (PSA) velocity 08/01/2014  . Elevated blood pressure reading without diagnosis of hypertension 08/01/2014  . HYPERLIPIDEMIA 05/26/2008  . ERECTILE DYSFUNCTION 05/26/2008  . ALLERGIC RHINITIS 05/26/2008   Prior to Admission medications   Medication Sig Start Date End Date Taking? Authorizing Provider  aspirin 81 MG tablet Take 81 mg by mouth daily.   Yes Historical Provider, MD  atorvastatin (LIPITOR) 20 MG tablet Take 1 tablet (20 mg total) by mouth daily. 08/01/14 08/01/15 Yes Corwin Levins, MD  Multiple Vitamin (MULTI VITAMIN DAILY) TABS Take by mouth.   Yes Historical Provider, MD         sildenafil (VIAGRA) 100 MG tablet Take 1 tablet (100 mg total) by mouth daily as needed for  erectile dysfunction. 08/01/14 08/31/14  Corwin Levins, MD   No Known Allergies  Patient's social and family history were reviewed.     Objective:   Physical Exam  Constitutional: He is oriented to person, place, and time. He appears well-developed and well-nourished. No distress.  HENT:  Head: Normocephalic and atraumatic.  Right Ear: Hearing, tympanic membrane, external ear and ear canal normal.  Left Ear: Hearing, tympanic membrane, external ear and ear canal normal.  Nose: Nose normal. Right sinus exhibits no maxillary sinus tenderness and no frontal sinus tenderness. Left sinus exhibits no maxillary sinus tenderness and no frontal sinus tenderness.  Mouth/Throat: Uvula is midline and mucous membranes are normal. Posterior oropharyngeal erythema present. No oropharyngeal exudate or posterior oropharyngeal edema.  Eyes: Conjunctivae and lids are normal. Right eye exhibits no discharge. Left eye exhibits no discharge. No scleral icterus.  Cardiovascular: Normal rate, regular rhythm, normal heart sounds, intact distal pulses and normal pulses.   No murmur heard. Pulmonary/Chest: Effort normal and breath sounds normal. No respiratory distress. He has no wheezes. He has no rhonchi. He has no rales.  Musculoskeletal: Normal range of motion.  Lymphadenopathy:       Head (right side): No submental, no submandibular and no tonsillar adenopathy present.       Head (left side): No submental, no submandibular and no tonsillar adenopathy present.    He has no cervical adenopathy.  Neurological: He is alert and oriented to person, place, and time.  Skin: Skin is warm, dry and intact. No lesion and no  rash noted.  Psychiatric: He has a normal mood and affect. His speech is normal and behavior is normal. Thought content normal.   BP 140/80 mmHg  Pulse 97  Temp(Src) 98.4 F (36.9 C) (Oral)  Resp 16  Ht 6\' 2"  (1.88 m)  Wt 235 lb 6.4 oz (106.777 kg)  BMI 30.21 kg/m2  SpO2 98%     Assessment &  Plan:  1. Viral URI with cough Etiology likely viral. Focus is on supportive care, see meds prescribed below. He will return if 7-10 days if symptoms are not improving.  - Guaifenesin (MUCINEX MAXIMUM STRENGTH) 1200 MG TB12; Take 1 tablet (1,200 mg total) by mouth every 12 (twelve) hours as needed.  Dispense: 14 tablet; Refill: 1 - Alum & Mag Hydroxide-Simeth (MAGIC MOUTHWASH W/LIDOCAINE) SOLN; Take 10 mLs by mouth every 2 (two) hours as needed for mouth pain.  Dispense: 360 mL; Refill: 0 - chlorpheniramine-HYDROcodone (TUSSIONEX PENNKINETIC ER) 10-8 MG/5ML SUER; Take 5 mLs by mouth every 12 (twelve) hours as needed for cough.  Dispense: 100 mL; Refill: 0   Roswell MinersNicole V. Dyke BrackettBush, PA-C, MHS [facility]  03/05/2015

## 2015-10-02 ENCOUNTER — Other Ambulatory Visit: Payer: Self-pay | Admitting: Internal Medicine

## 2021-06-28 ENCOUNTER — Ambulatory Visit
Admission: EM | Admit: 2021-06-28 | Discharge: 2021-06-28 | Disposition: A | Discharge status: Home / Self Care | Payer: Self-pay | Attending: Emergency Medicine | Admitting: Emergency Medicine

## 2021-06-28 ENCOUNTER — Other Ambulatory Visit: Payer: Self-pay

## 2021-06-28 DIAGNOSIS — B349 Viral infection, unspecified: Secondary | ICD-10-CM | POA: Insufficient documentation

## 2021-06-28 DIAGNOSIS — Z20822 Contact with and (suspected) exposure to covid-19: Secondary | ICD-10-CM | POA: Insufficient documentation

## 2021-06-28 LAB — POCT RAPID STREP A (OFFICE): Rapid Strep A Screen: NEGATIVE

## 2021-06-28 MED ORDER — ALBUTEROL SULFATE HFA 108 (90 BASE) MCG/ACT IN AERS: 2.0000 | INHALATION_SPRAY | RESPIRATORY_TRACT | 0 refills | Status: AC | PRN

## 2021-06-28 MED ORDER — PROMETHAZINE-DM 6.25-15 MG/5ML PO SYRP
5.0000 mL | ORAL_SOLUTION | Freq: Four times a day (QID) | ORAL | 0 refills | Status: DC | PRN
Start: 2021-06-28 — End: 2022-04-20

## 2021-06-28 MED ORDER — BENZONATATE 100 MG PO CAPS: 100.0000 mg | ORAL_CAPSULE | Freq: Three times a day (TID) | ORAL | 0 refills | Status: DC

## 2021-06-28 NOTE — ED Triage Notes (Signed)
Pt c/o sore throat, cough, and nasal congestion since Friday. States hx of strep and COVID x2. States hx of HTN and not currently on medication. Pts B/P is elevated on arrival.

## 2021-06-28 NOTE — Discharge Instructions (Addendum)
Rapid strep test today is negative  Your COVID test is pending 2 to 5 days, you will be contacted if positive, if positive and vaccinated the CDC recommends a 5-day quarantine from the onset of symptoms then you may return to normal activity as long as you do not have fevers, if negative you may return to normal activities  You may use Tessalon pills every 8 hours as needed to assist with cough  You may use Promethazine DM taking 5 mils every 4 hours as needed for cough, be mindful this medication may make you drowsy I do advised using a dose before bed to help you sleep  You may take 2 puffs of the albuterol inhaler every 4 hours as needed for shortness of breath  Below are additional suggestions for over-the-counter medications and home remedies to help with symptoms   you can take Tylenol and/or Ibuprofen as needed for fever reduction and pain relief.   For cough: honey 1/2 to 1 teaspoon (you can dilute the honey in water or another fluid).  You can also use guaifenesin for cough. You can use a humidifier for chest congestion and cough.  If you don't have a humidifier, you can sit in the bathroom with the hot shower running.      For sore throat: try warm salt water gargles, cepacol lozenges, throat spray, warm tea or water with lemon/honey, popsicles or ice, or OTC cold relief medicine for throat discomfort.   For congestion: take a daily anti-histamine like Zyrtec, Claritin, and a oral decongestant, such as pseudoephedrine.  You can also use Flonase 1-2 sprays in each nostril daily.   It is important to stay hydrated: drink plenty of fluids (water, gatorade/powerade/pedialyte, juices, or teas) to keep your throat moisturized and help further relieve irritation/discomfort.    Return or go to the Emergency Department if symptoms worsen or do not improve in the next few days.

## 2021-06-28 NOTE — ED Provider Notes (Signed)
[name]    CSN: [id number] Arrival date & time: 06/28/21  8921      History   Chief Complaint Chief Complaint  Patient presents with   Sore Throat    HPI Steven Mcclain is a 55 y.o. male.    Of nasal congestion, rhinorrhea, sore throat, painful to swallow, productive cough and intermittent shortness of breath for 3 days.  Denies fever, chills, body aches, headaches, ear pain or fullness, wheezing, abdominal pain, nausea, vomiting, diarrhea.  No known sick contacts.  Vaccinated.  Has had COVID and strep before.  Has used an array of over-the-counter medicines without success.  Teaspoons of honey providing the only relief.  Past Medical History:  Diagnosis Date   ALLERGIC RHINITIS 05/26/2008   ELEVATED BLOOD PRESSURE WITHOUT DIAGNOSIS OF HYPERTENSION 05/26/2008   ERECTILE DYSFUNCTION 05/26/2008   HYPERLIPIDEMIA 05/26/2008   Impaired glucose tolerance 08/01/2014    Patient Active Problem List   Diagnosis Date Noted   Impaired glucose tolerance 08/01/2014   Increased prostate specific antigen (PSA) velocity 08/01/2014   Elevated blood pressure reading without diagnosis of hypertension 08/01/2014   HYPERLIPIDEMIA 05/26/2008   ERECTILE DYSFUNCTION 05/26/2008   ALLERGIC RHINITIS 05/26/2008    Past Surgical History:  Procedure Laterality Date   s/p facial cyst  1984       Home Medications    Prior to Admission medications   Medication Sig Start Date End Date Taking? Authorizing Provider  albuterol (VENTOLIN HFA) 108 (90 Base) MCG/ACT inhaler Inhale 2 puffs into the lungs every 4 (four) hours as needed for wheezing or shortness of breath. 06/28/21  Yes Linah Klapper R, NP  benzonatate (TESSALON) 100 MG capsule Take 1 capsule (100 mg total) by mouth every 8 (eight) hours. 06/28/21  Yes Shanyah Gattuso, Elita Boone, NP  promethazine-dextromethorphan (PROMETHAZINE-DM) 6.25-15 MG/5ML syrup Take 5 mLs by mouth 4 (four) times daily as needed for cough. 06/28/21  Yes Salli Quarry R, NP  aspirin 81 MG tablet Take 81 mg by mouth daily.    [provider]  Multiple Vitamin (MULTI VITAMIN DAILY) TABS Take by mouth.    [provider]  sildenafil (VIAGRA) 100 MG tablet Take 1 tablet (100 mg total) by mouth daily as needed for erectile dysfunction. 08/01/14 08/31/14  Corwin Levins, MD    Family History Family History  Problem Relation Age of Onset   Coronary artery disease Father    Diabetes Father    Heart disease Father    Hyperlipidemia Father    Cancer Other    Mental retardation Mother    Stroke Maternal Grandmother     Social History Social History   Tobacco Use   Smoking status: Former    Types: Cigars   Smokeless tobacco: Never   Tobacco comments:    4 cigars per year  Substance Use Topics   Alcohol use: Yes    Comment: social   Drug use: Not Currently     Allergies   Patient has no known allergies.   Review of Systems Review of Systems Defer to HPI    Physical Exam Triage Vital Signs ED Triage Vitals  Enc Vitals Group     BP 06/28/21 0829 (!) 161/108     Pulse Rate 06/28/21 0829 90     Resp 06/28/21 0829 18     Temp 06/28/21 0829 99.3 F (37.4 C)     Temp Source 06/28/21 0829 Oral     SpO2 06/28/21 0829 97 %  Weight --      Height --      Head Circumference --      Peak Flow --      Pain Score 06/28/21 0830 7     Pain Loc --      Pain Edu? --      Excl. in GC? --    No data found.  Updated Vital Signs BP (!) 161/108 (BP Location: Left Arm)   Pulse 90   Temp 99.3 F (37.4 C) (Oral)   Resp 18   SpO2 97%   Visual Acuity Right Eye Distance:   Left Eye Distance:   Bilateral Distance:    Right Eye Near:   Left Eye Near:    Bilateral Near:     Physical Exam Constitutional:      Appearance: Normal appearance. He is normal weight.  HENT:     Head: Normocephalic.     Right Ear: Tympanic membrane, ear canal and external ear normal.     Left Ear: Tympanic membrane, ear canal and  external ear normal.     Nose: Congestion and rhinorrhea present.     Mouth/Throat:     Mouth: Mucous membranes are moist.     Pharynx: Posterior oropharyngeal erythema present.     Tonsils: No tonsillar exudate. 0 on the right. 0 on the left.  Eyes:     Extraocular Movements: Extraocular movements intact.  Cardiovascular:     Rate and Rhythm: Normal rate and regular rhythm.     Pulses: Normal pulses.     Heart sounds: Normal heart sounds.  Pulmonary:     Effort: Pulmonary effort is normal.     Breath sounds: Normal breath sounds.  Musculoskeletal:     Cervical back: Normal range of motion and neck supple.  Lymphadenopathy:     Cervical: No cervical adenopathy.  Skin:    General: Skin is warm and dry.  Neurological:     Mental Status: He is alert and oriented to person, place, and time. Mental status is at baseline.  Psychiatric:        Mood and Affect: Mood normal.        Behavior: Behavior normal.     UC Treatments / Results  Labs (all labs ordered are listed, but only abnormal results are displayed) Labs Reviewed  NOVEL CORONAVIRUS, NAA  CULTURE, GROUP A STREP [facility])  POCT RAPID STREP A (OFFICE)    EKG   Radiology No results found.  Procedures Procedures (including critical care time)  Medications Ordered in UC Medications - No data to display  Initial Impression / Assessment and Plan / UC Course  I have reviewed the triage vital signs and the nursing notes.  Pertinent labs & imaging results that were available during my care of the patient were reviewed by me and considered in my medical decision making (see chart for details).  Viral illness Encounter for COVID testing  1.  Rapid strep negative 2.  COVID test pending 3.  Tessalon 100 mg 3 times daily. 4.  Promethazine DM 6.25-15mg  /5 mL every 4 hours as needed 5.  Albuterol inhaler 90 mcg 2 puffs every 4 hours as needed 6.  Over-the-counter medications for remaining symptoms 7.  Return precautions  given for worsening signs of infection Final Clinical Impressions(s) / UC Diagnoses   Final diagnoses:  Encounter for screening laboratory testing for COVID-19 virus     Discharge Instructions      Rapid strep test today is negative  Your COVID test is pending 2 to 5 days, you will be contacted if positive, if positive and vaccinated the CDC recommends a 5-day quarantine from the onset of symptoms then you may return to normal activity as long as you do not have fevers, if negative you may return to normal activities  You may use Tessalon pills every 8 hours as needed to assist with cough  You may use Promethazine DM taking 5 mils every 4 hours as needed for cough, be mindful this medication may make you drowsy I do advised using a dose before bed to help you sleep  You may take 2 puffs of the albuterol inhaler every 4 hours as needed for shortness of breath  Below are additional suggestions for over-the-counter medications and home remedies to help with symptoms   you can take Tylenol and/or Ibuprofen as needed for fever reduction and pain relief.   For cough: honey 1/2 to 1 teaspoon (you can dilute the honey in water or another fluid).  You can also use guaifenesin for cough. You can use a humidifier for chest congestion and cough.  If you don't have a humidifier, you can sit in the bathroom with the hot shower running.      For sore throat: try warm salt water gargles, cepacol lozenges, throat spray, warm tea or water with lemon/honey, popsicles or ice, or OTC cold relief medicine for throat discomfort.   For congestion: take a daily anti-histamine like Zyrtec, Claritin, and a oral decongestant, such as pseudoephedrine.  You can also use Flonase 1-2 sprays in each nostril daily.   It is important to stay hydrated: drink plenty of fluids (water, gatorade/powerade/pedialyte, juices, or teas) to keep your throat moisturized and help further relieve irritation/discomfort.    Return or  go to the Emergency Department if symptoms worsen or do not improve in the next few days.       ED Prescriptions     Medication Sig Dispense Auth. Provider   benzonatate (TESSALON) 100 MG capsule Take 1 capsule (100 mg total) by mouth every 8 (eight) hours. 21 capsule Weslynn Ke R, NP   promethazine-dextromethorphan (PROMETHAZINE-DM) 6.25-15 MG/5ML syrup Take 5 mLs by mouth 4 (four) times daily as needed for cough. 118 mL Malike Foglio R, NP   albuterol (VENTOLIN HFA) 108 (90 Base) MCG/ACT inhaler Inhale 2 puffs into the lungs every 4 (four) hours as needed for wheezing or shortness of breath. 18 g Valinda Hoar, NP      PDMP not reviewed this encounter.   Valinda Hoar, [place] 06/28/21 [phone]

## 2021-06-29 LAB — NOVEL CORONAVIRUS, NAA: SARS-CoV-2, NAA: NOT DETECTED

## 2021-06-29 LAB — SARS-COV-2, NAA 2 DAY TAT

## 2021-07-01 LAB — CULTURE, GROUP A STREP (THRC)

## 2022-04-20 ENCOUNTER — Ambulatory Visit (INDEPENDENT_AMBULATORY_CARE_PROVIDER_SITE_OTHER): Payer: 59 | Admitting: Nurse Practitioner

## 2022-04-20 ENCOUNTER — Encounter: Payer: Self-pay | Admitting: Nurse Practitioner

## 2022-04-20 VITALS — BP 167/93 | HR 76 | Temp 97.4°F | Ht 74.02 in | Wt 231.8 lb

## 2022-04-20 DIAGNOSIS — Z7689 Persons encountering health services in other specified circumstances: Secondary | ICD-10-CM

## 2022-04-20 DIAGNOSIS — K219 Gastro-esophageal reflux disease without esophagitis: Secondary | ICD-10-CM

## 2022-04-20 DIAGNOSIS — N400 Enlarged prostate without lower urinary tract symptoms: Secondary | ICD-10-CM

## 2022-04-20 DIAGNOSIS — M545 Low back pain, unspecified: Secondary | ICD-10-CM | POA: Diagnosis not present

## 2022-04-20 MED ORDER — OMEPRAZOLE 20 MG PO TBDD: 1.0000 | DELAYED_RELEASE_TABLET | Freq: Every day | ORAL | 1 refills | Status: DC

## 2022-04-20 MED ORDER — PREDNISONE 10 MG (21) PO TBPK: ORAL_TABLET | ORAL | 0 refills | Status: DC

## 2022-04-20 MED ORDER — METHYLPREDNISOLONE ACETATE 80 MG/ML IJ SUSP
80.0000 mg | Freq: Once | INTRAMUSCULAR | Status: AC
  Administered 2022-04-20: 80 mg via INTRAMUSCULAR

## 2022-04-20 MED ORDER — TADALAFIL 20 MG PO TABS: ORAL_TABLET | ORAL | 5 refills | Status: AC

## 2022-04-20 MED ORDER — CYCLOBENZAPRINE HCL 10 MG PO TABS: 10.0000 mg | ORAL_TABLET | Freq: Two times a day (BID) | ORAL | 1 refills | Status: DC | PRN

## 2022-04-20 MED ORDER — DICLOFENAC SODIUM 50 MG PO TBEC: 50.0000 mg | DELAYED_RELEASE_TABLET | Freq: Two times a day (BID) | ORAL | 1 refills | Status: DC

## 2022-04-20 NOTE — Progress Notes (Signed)
New Patient Office Visit  Subjective    Patient ID: Steven Mcclain, male    DOB: 1966-03-29  Age: 56 y.o. MRN: [id number]  CC:  Chief Complaint  Patient presents with   New Patient (Initial Visit)    HPI Steven Mcclain presents to establish care Recently moved back to the area last fall from [place]. He states that this is his first "post-COVID" doctors visit.  -yesterday, was lifting his wife's wheelchair. Got sudden pain in the middle, lower part of his back. If still, he has dull, constant ache, but if he is not completely balanced, the pain is sharp and severe. The patient does not radiate. He feels no weakness in his legs. Denies loss of bowel or bladder control. Any change of position causes pain to be severe. He has taken combination of NSAIDs and tylenol. He has not noted any improvement.  -does need refills for some of his routine medications   Outpatient Encounter Medications as of 04/20/2022  Medication Sig   albuterol (VENTOLIN HFA) 108 (90 Base) MCG/ACT inhaler Inhale 2 puffs into the lungs every 4 (four) hours as needed for wheezing or shortness of breath.   aspirin 81 MG tablet Take 81 mg by mouth daily.   cyclobenzaprine (FLEXERIL) 10 MG tablet Take 1 tablet (10 mg total) by mouth 2 (two) times daily as needed for muscle spasms.   diclofenac (VOLTAREN) 50 MG EC tablet Take 1 tablet (50 mg total) by mouth 2 (two) times daily.   Multiple Vitamin (MULTI VITAMIN DAILY) TABS Take by mouth.   predniSONE (STERAPRED UNI-PAK 21 TAB) 10 MG (21) TBPK tablet 6 day taper - take by mouth as directed for 6 days   tadalafil (CIALIS) 20 MG tablet Take 1 tablet po prn   [DISCONTINUED] Omeprazole 20 MG TBDD    Omeprazole 20 MG TBDD Take 1 capsule by mouth daily.   [DISCONTINUED] benzonatate (TESSALON) 100 MG capsule Take 1 capsule (100 mg total) by mouth every 8 (eight) hours.   [DISCONTINUED] promethazine-dextromethorphan (PROMETHAZINE-DM) 6.25-15 MG/5ML syrup Take 5 mLs by mouth  4 (four) times daily as needed for cough.   [DISCONTINUED] sildenafil (VIAGRA) 100 MG tablet Take 1 tablet (100 mg total) by mouth daily as needed for erectile dysfunction.   [EXPIRED] methylPREDNISolone acetate (DEPO-MEDROL) injection 80 mg    No facility-administered encounter medications on file as of 04/20/2022.    Past Medical History:  Diagnosis Date   ALLERGIC RHINITIS 05/26/2008   ELEVATED BLOOD PRESSURE WITHOUT DIAGNOSIS OF HYPERTENSION 05/26/2008   ERECTILE DYSFUNCTION 05/26/2008   HYPERLIPIDEMIA 05/26/2008   Impaired glucose tolerance 08/01/2014    Past Surgical History:  Procedure Laterality Date   s/p facial cyst  1984    Family History  Problem Relation Age of Onset   Coronary artery disease Father    Diabetes Father    Heart disease Father    Hyperlipidemia Father    Cancer Other    Mental retardation Mother    Stroke Maternal Grandmother     Social History   Socioeconomic History   Marital status: Married    Spouse name: Not on file   Number of children: 3   Years of education: Not on file   Highest education level: Not on file  Occupational History   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  Tobacco Use   Smoking status: Former    Types: Cigars   Smokeless tobacco: Never   Tobacco comments:  4 cigars per year  Substance and Sexual Activity   Alcohol use: Yes    Comment: social   Drug use: Not Currently   Sexual activity: Not on file  Other Topics Concern   Not on file  Social History Narrative   Not on file   Social Determinants of Health   Financial Resource Strain: Not on file  Food Insecurity: Not on file  Transportation Needs: Not on file  Physical Activity: Not on file  Stress: Not on file  Social Connections: Not on file  Intimate Partner Violence: Not on file    Review of Systems  Constitutional:  Negative for chills, fever and malaise/fatigue.  HENT:  Negative for congestion, sinus pain and sore  throat.   Eyes: Negative.   Respiratory:  Negative for cough, shortness of breath and wheezing.   Cardiovascular:  Negative for chest pain, palpitations and leg swelling.  Gastrointestinal:  Negative for constipation, diarrhea, nausea and vomiting.  Genitourinary: Negative.   Musculoskeletal:  Positive for myalgias and neck pain.  Skin: Negative.   Neurological:  Negative for dizziness and headaches.  Endo/Heme/Allergies:  Does not bruise/bleed easily.  Psychiatric/Behavioral:  Negative for depression. The patient is not nervous/anxious.         Objective    Today's Vitals   04/20/22 0923 04/20/22 0956  BP: (!) 176/96 (!) 167/93  Pulse: 76   Temp: (!) 97.4 F (36.3 C)   SpO2: 97%   Weight: 231 lb 12.8 oz (105.1 kg)   Height: 6' 2.02" (1.88 m)    Body mass index is 29.75 kg/m.   Physical Exam Vitals and nursing note reviewed.  Constitutional:      Appearance: Normal appearance. He is well-developed.  HENT:     Head: Normocephalic and atraumatic.  Eyes:     Pupils: Pupils are equal, round, and reactive to light.  Cardiovascular:     Rate and Rhythm: Normal rate and regular rhythm.     Pulses: Normal pulses.     Heart sounds: Normal heart sounds.  Pulmonary:     Effort: Pulmonary effort is normal.     Breath sounds: Normal breath sounds.  Abdominal:     Palpations: Abdomen is soft.  Musculoskeletal:        General: Normal range of motion.     Cervical back: Normal range of motion and neck supple.     Comments: Moderate back pain across the entire low back.  Bending, twisting, and changing positions increased pain.  With the arm increases pain.  No palpable abnormalities or deformities present.  No weakness present in lower extremities.  Lymphadenopathy:     Cervical: No cervical adenopathy.  Skin:    General: Skin is warm and dry.     Capillary Refill: Capillary refill takes less than 2 seconds.  Neurological:     General: No focal deficit present.     Mental  Status: He is alert and oriented to person, place, and time.  Psychiatric:        Mood and Affect: Mood normal.        Behavior: Behavior normal.        Thought Content: Thought content normal.        Judgment: Judgment normal.      Assessment & Plan:  1. Acute midline low back pain without sciatica Depo-Medrol 80 mg injection administered IM during today's visit.  Patient tolerated this well.  Follow-up prednisone taper tomorrow.  Take as directed for 6 days.  Flexeril 10 mg to be taken up to twice daily as needed for muscle spasms.  Voltaren 50 mg tablets to be taken twice daily as needed for pain inflammation. - predniSONE (STERAPRED UNI-PAK 21 TAB) 10 MG (21) TBPK tablet; 6 day taper - take by mouth as directed for 6 days  Dispense: 21 tablet; Refill: 0 - cyclobenzaprine (FLEXERIL) 10 MG tablet; Take 1 tablet (10 mg total) by mouth 2 (two) times daily as needed for muscle spasms.  Dispense: 30 tablet; Refill: 1 - diclofenac (VOLTAREN) 50 MG EC tablet; Take 1 tablet (50 mg total) by mouth 2 (two) times daily.  Dispense: 45 tablet; Refill: 1 - methylPREDNISolone acetate (DEPO-MEDROL) injection 80 mg  2. Prostatic hypertrophy Refill tadalafil 20 mg tablets.  Take as needed and as prescribed. - tadalafil (CIALIS) 20 MG tablet; Take 1 tablet po prn  Dispense: 10 tablet; Refill: 5  3. Gastroesophageal reflux disease without esophagitis May take omeprazole 20 mg daily.  New prescription sent to his pharmacy. - Omeprazole 20 MG TBDD; Take 1 capsule by mouth daily.  Dispense: 90 tablet; Refill: 1  4. Encounter to establish care Appointment today to establish new primary care provider     Problem List Items Addressed This Visit       Digestive   Gastroesophageal reflux disease without esophagitis   Relevant Medications   Omeprazole 20 MG TBDD     Other   Acute midline low back pain without sciatica - Primary   Relevant Medications   predniSONE (STERAPRED UNI-PAK 21 TAB) 10 MG (21)  TBPK tablet   cyclobenzaprine (FLEXERIL) 10 MG tablet   diclofenac (VOLTAREN) 50 MG EC tablet   Prostatic hypertrophy   Relevant Medications   tadalafil (CIALIS) 20 MG tablet   Other Visit Diagnoses     Encounter to establish care           Return in about 5 weeks (around 05/25/2022) for health maintenance exam, FBW a week prior to visit.   Ronnell Freshwater, NP

## 2022-04-24 DIAGNOSIS — K219 Gastro-esophageal reflux disease without esophagitis: Secondary | ICD-10-CM | POA: Insufficient documentation

## 2022-04-24 DIAGNOSIS — N400 Enlarged prostate without lower urinary tract symptoms: Secondary | ICD-10-CM | POA: Insufficient documentation

## 2022-04-24 DIAGNOSIS — M545 Low back pain, unspecified: Secondary | ICD-10-CM | POA: Insufficient documentation

## 2022-04-25 ENCOUNTER — Observation Stay (HOSPITAL_COMMUNITY)
Admission: EM | Admit: 2022-04-25 | Discharge: 2022-04-26 | Disposition: A | Discharge status: Home / Self Care | Payer: 59 | Attending: Internal Medicine | Admitting: Internal Medicine

## 2022-04-25 ENCOUNTER — Emergency Department (HOSPITAL_COMMUNITY): Payer: 59

## 2022-04-25 ENCOUNTER — Encounter (HOSPITAL_COMMUNITY): Payer: Self-pay

## 2022-04-25 DIAGNOSIS — Z87891 Personal history of nicotine dependence: Secondary | ICD-10-CM | POA: Diagnosis not present

## 2022-04-25 DIAGNOSIS — R030 Elevated blood-pressure reading, without diagnosis of hypertension: Secondary | ICD-10-CM | POA: Diagnosis present

## 2022-04-25 DIAGNOSIS — I10 Essential (primary) hypertension: Secondary | ICD-10-CM | POA: Insufficient documentation

## 2022-04-25 DIAGNOSIS — R739 Hyperglycemia, unspecified: Secondary | ICD-10-CM | POA: Diagnosis present

## 2022-04-25 DIAGNOSIS — E785 Hyperlipidemia, unspecified: Secondary | ICD-10-CM | POA: Diagnosis present

## 2022-04-25 DIAGNOSIS — D72829 Elevated white blood cell count, unspecified: Secondary | ICD-10-CM | POA: Insufficient documentation

## 2022-04-25 DIAGNOSIS — Z20822 Contact with and (suspected) exposure to covid-19: Secondary | ICD-10-CM | POA: Diagnosis not present

## 2022-04-25 DIAGNOSIS — Z79899 Other long term (current) drug therapy: Secondary | ICD-10-CM | POA: Insufficient documentation

## 2022-04-25 DIAGNOSIS — R200 Anesthesia of skin: Secondary | ICD-10-CM | POA: Diagnosis present

## 2022-04-25 DIAGNOSIS — E1165 Type 2 diabetes mellitus with hyperglycemia: Secondary | ICD-10-CM | POA: Insufficient documentation

## 2022-04-25 DIAGNOSIS — Z7982 Long term (current) use of aspirin: Secondary | ICD-10-CM | POA: Diagnosis not present

## 2022-04-25 DIAGNOSIS — E875 Hyperkalemia: Secondary | ICD-10-CM | POA: Diagnosis present

## 2022-04-25 DIAGNOSIS — R7401 Elevation of levels of liver transaminase levels: Secondary | ICD-10-CM

## 2022-04-25 DIAGNOSIS — G459 Transient cerebral ischemic attack, unspecified: Principal | ICD-10-CM | POA: Diagnosis present

## 2022-04-25 LAB — URINALYSIS, ROUTINE W REFLEX MICROSCOPIC
Bacteria, UA: NONE SEEN
Bilirubin Urine: NEGATIVE
Glucose, UA: >=500 mg/dL — AB
Hgb urine dipstick: NEGATIVE
Ketones, ur: NEGATIVE mg/dL
Leukocytes,Ua: NEGATIVE
Nitrite: NEGATIVE
Protein, ur: NEGATIVE mg/dL
Specific Gravity, Urine: 1.011 (ref 1.005–1.030)
pH: 6 (ref 5.0–8.0)

## 2022-04-25 LAB — CBC
HCT: 46.4 % (ref 39.0–52.0)
Hemoglobin: 16.8 g/dL (ref 13.0–17.0)
MCH: 32.8 pg (ref 26.0–34.0)
MCHC: 36.2 g/dL — ABNORMAL HIGH (ref 30.0–36.0)
MCV: 90.6 fL (ref 80.0–100.0)
Platelets: 281 10*3/uL (ref 150–400)
RBC: 5.12 MIL/uL (ref 4.22–5.81)
RDW: 13.9 % (ref 11.5–15.5)
WBC: 14.2 10*3/uL — ABNORMAL HIGH (ref 4.0–10.5)
nRBC: 0 % (ref 0.0–0.2)

## 2022-04-25 LAB — I-STAT CHEM 8, ED
BUN: 35 mg/dL — ABNORMAL HIGH (ref 6–20)
Calcium, Ion: 1.06 mmol/L — ABNORMAL LOW (ref 1.15–1.40)
Chloride: 102 mmol/L (ref 98–111)
Creatinine, Ser: 1.1 mg/dL (ref 0.61–1.24)
Glucose, Bld: 272 mg/dL — ABNORMAL HIGH (ref 70–99)
HCT: 46 % (ref 39.0–52.0)
Hemoglobin: 15.6 g/dL (ref 13.0–17.0)
Potassium: 6.9 mmol/L (ref 3.5–5.1)
Sodium: 133 mmol/L — ABNORMAL LOW (ref 135–145)
TCO2: 26 mmol/L (ref 22–32)

## 2022-04-25 LAB — DIFFERENTIAL
Abs Immature Granulocytes: 0.17 10*3/uL — ABNORMAL HIGH (ref 0.00–0.07)
Basophils Absolute: 0.1 10*3/uL (ref 0.0–0.1)
Basophils Relative: 0 %
Eosinophils Absolute: 0 10*3/uL (ref 0.0–0.5)
Eosinophils Relative: 0 %
Immature Granulocytes: 1 %
Lymphocytes Relative: 19 %
Lymphs Abs: 2.7 10*3/uL (ref 0.7–4.0)
Monocytes Absolute: 0.9 10*3/uL (ref 0.1–1.0)
Monocytes Relative: 6 %
Neutro Abs: 10.3 10*3/uL — ABNORMAL HIGH (ref 1.7–7.7)
Neutrophils Relative %: 74 %

## 2022-04-25 LAB — COMPREHENSIVE METABOLIC PANEL
ALT: 41 U/L (ref 0–44)
AST: 58 U/L — ABNORMAL HIGH (ref 15–41)
Albumin: 3.5 g/dL (ref 3.5–5.0)
Alkaline Phosphatase: 42 U/L (ref 38–126)
Anion gap: 9 (ref 5–15)
BUN: 22 mg/dL — ABNORMAL HIGH (ref 6–20)
CO2: 22 mmol/L (ref 22–32)
Calcium: 8.2 mg/dL — ABNORMAL LOW (ref 8.9–10.3)
Chloride: 103 mmol/L (ref 98–111)
Creatinine, Ser: 1.08 mg/dL (ref 0.61–1.24)
GFR, Estimated: >60 mL/min (ref >60)
Glucose, Bld: 260 mg/dL — ABNORMAL HIGH (ref 70–99)
Potassium: 6.1 mmol/L — ABNORMAL HIGH (ref 3.5–5.1)
Sodium: 134 mmol/L — ABNORMAL LOW (ref 135–145)
Total Bilirubin: 1.3 mg/dL — ABNORMAL HIGH (ref 0.3–1.2)
Total Protein: 5.6 g/dL — ABNORMAL LOW (ref 6.5–8.1)

## 2022-04-25 LAB — PROTIME-INR
INR: 1 (ref 0.8–1.2)
Prothrombin Time: 13.6 seconds (ref 11.4–15.2)

## 2022-04-25 LAB — RAPID URINE DRUG SCREEN, HOSP PERFORMED
Amphetamines: NOT DETECTED
Barbiturates: NOT DETECTED
Benzodiazepines: NOT DETECTED
Cocaine: NOT DETECTED
Opiates: NOT DETECTED
Tetrahydrocannabinol: NOT DETECTED

## 2022-04-25 LAB — MAGNESIUM: Magnesium: 2.2 mg/dL (ref 1.7–2.4)

## 2022-04-25 LAB — TROPONIN I (HIGH SENSITIVITY)
Troponin I (High Sensitivity): 10 ng/L (ref <18)
Troponin I (High Sensitivity): 9 ng/L (ref <18)

## 2022-04-25 LAB — POTASSIUM: Potassium: 3.9 mmol/L (ref 3.5–5.1)

## 2022-04-25 LAB — RESP PANEL BY RT-PCR (FLU A&B, COVID) ARPGX2
Influenza A by PCR: NEGATIVE
Influenza B by PCR: NEGATIVE
SARS Coronavirus 2 by RT PCR: NEGATIVE

## 2022-04-25 LAB — ETHANOL: Alcohol, Ethyl (B): <10 mg/dL (ref <10)

## 2022-04-25 LAB — CBG MONITORING, ED: Glucose-Capillary: 226 mg/dL — ABNORMAL HIGH (ref 70–99)

## 2022-04-25 LAB — APTT: aPTT: 24 seconds (ref 24–36)

## 2022-04-25 IMAGING — CR DG CHEST 2V
2 series · 2 of 2 positions shown · non-contrast
Comparison: [DATE]

CLINICAL DATA: Tingling left side of body

EXAM:
CHEST - 2 VIEW

[chest pa]
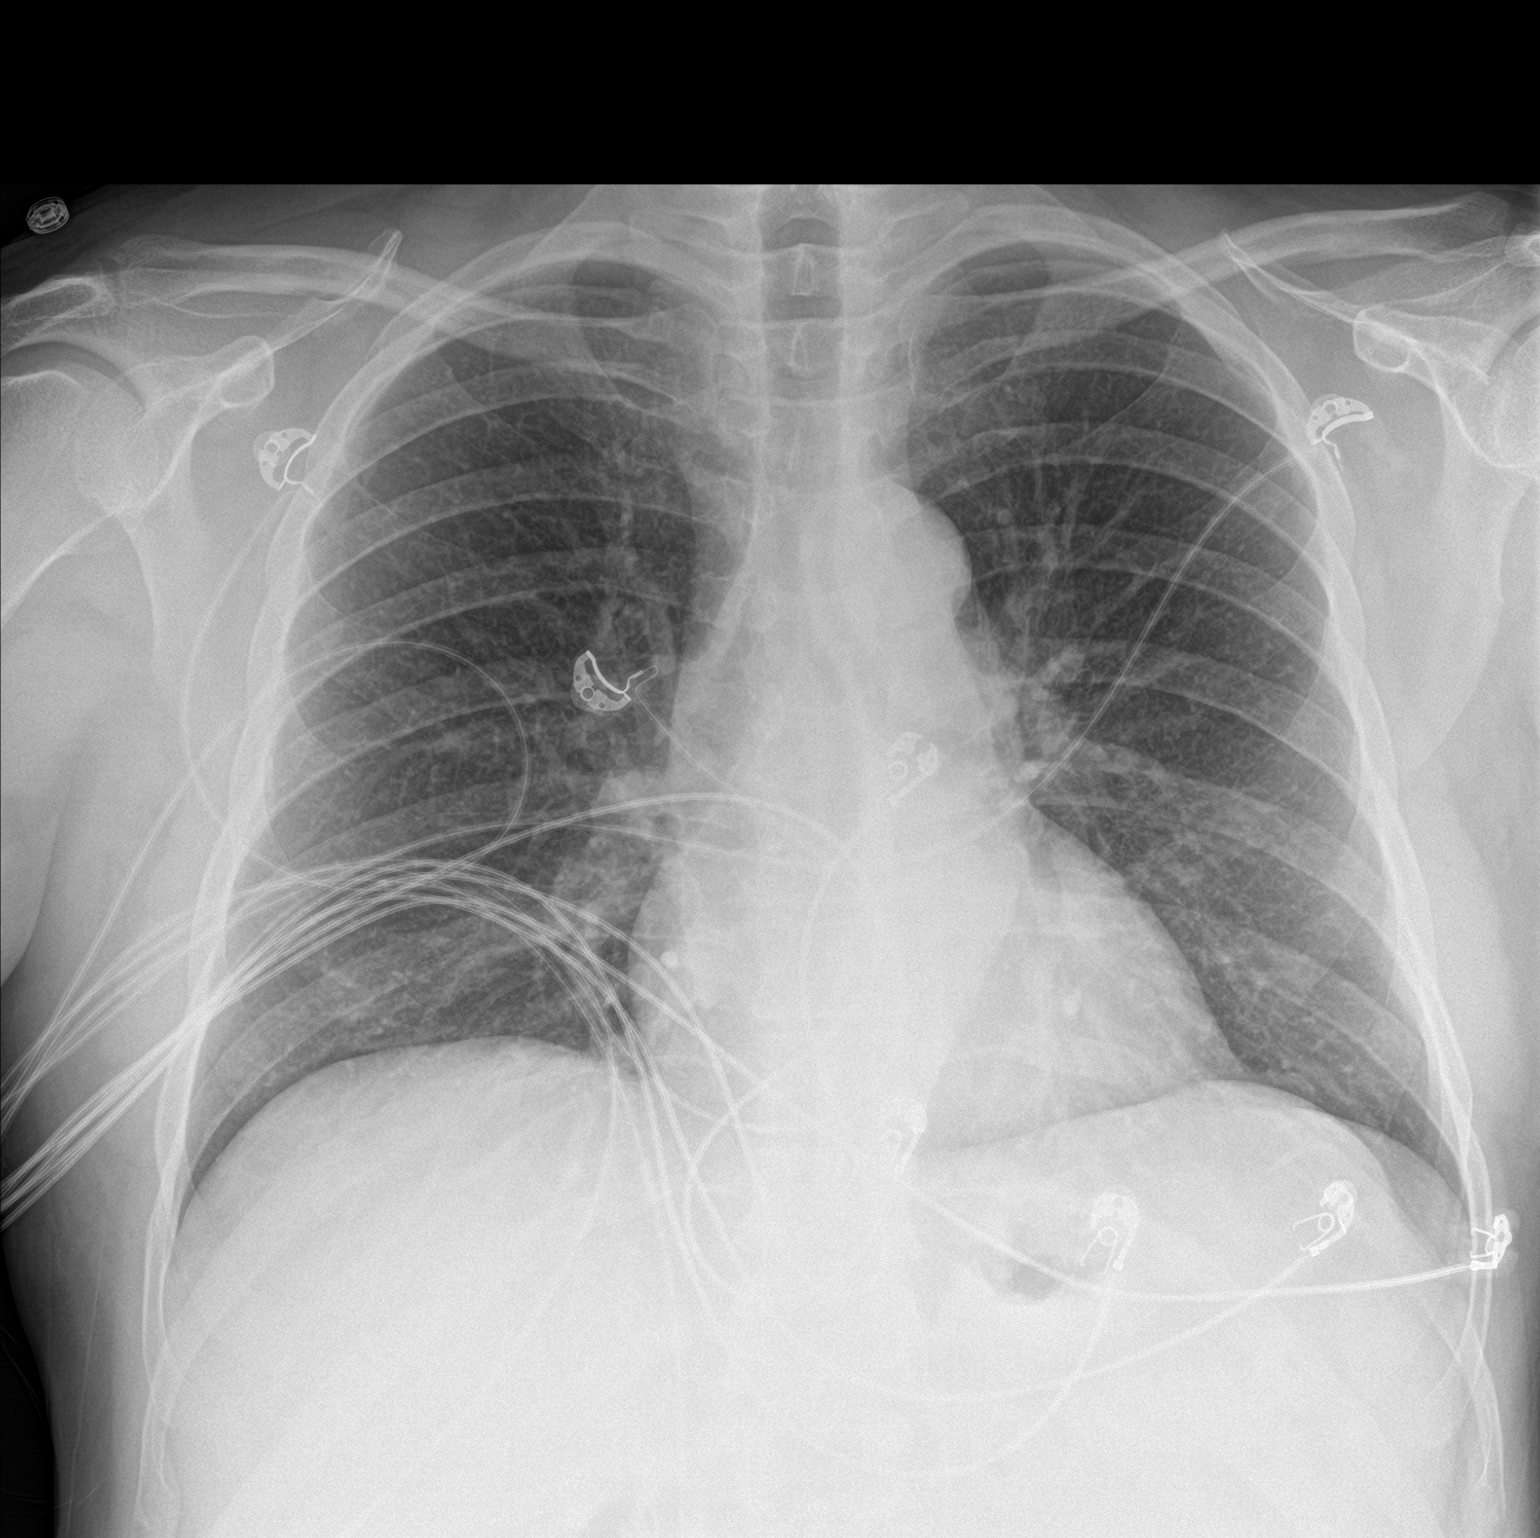

[chest lat]
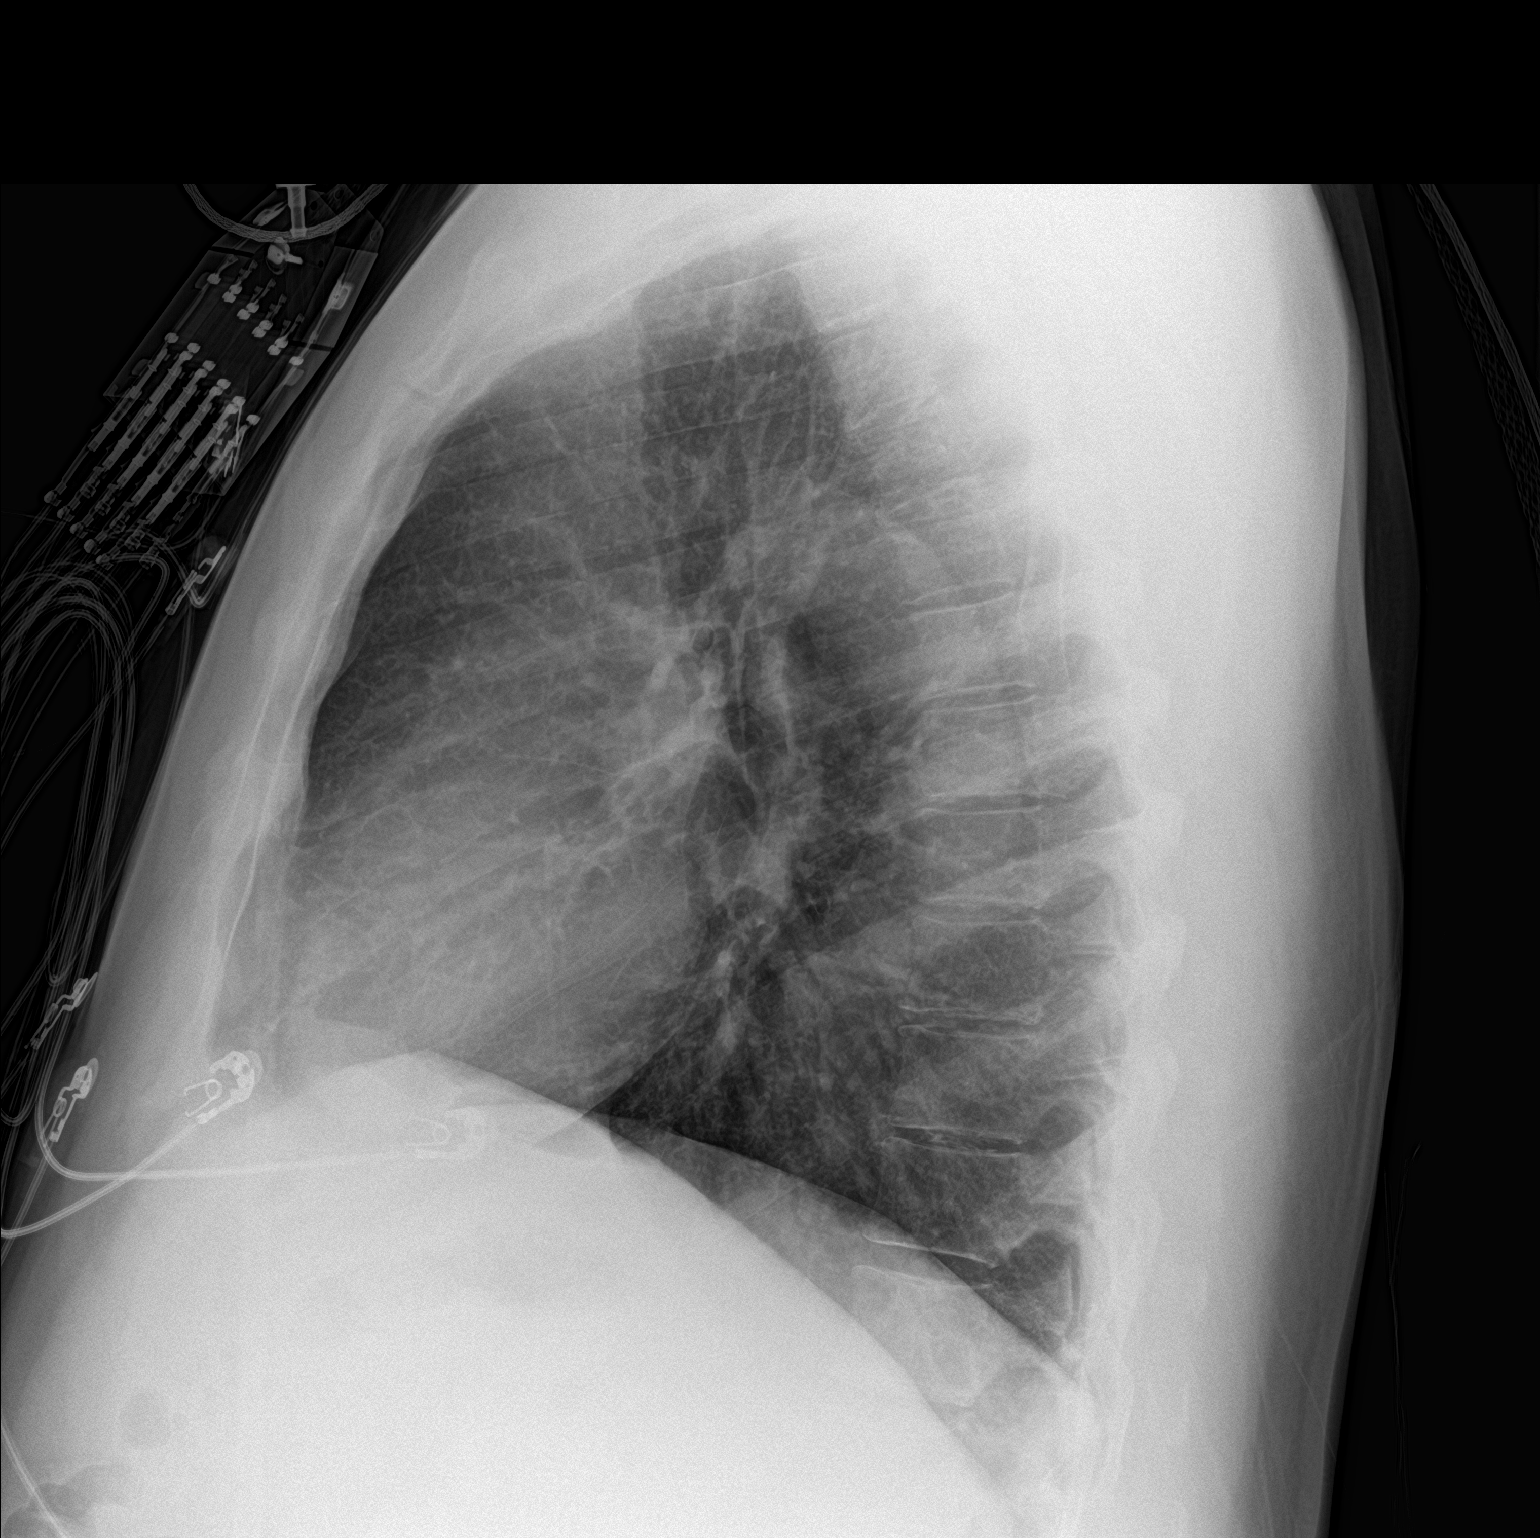

[2 of 2 positions shown; findings below may reference images not displayed]

FINDINGS: The heart size and mediastinal contours are within normal limits.
Both lungs are clear. The visualized skeletal structures are
unremarkable.
IMPRESSION: No active cardiopulmonary disease.

## 2022-04-25 IMAGING — MR MR HEAD W/O CM
6 of 10 series · 27 of 48 positions shown · non-contrast
Comparison: None Available.

CLINICAL DATA: Neuro deficit, acute, stroke suspected

EXAM:
MRI HEAD WITHOUT CONTRAST
TECHNIQUE: Multiplanar, multiecho pulse sequences of the brain and surrounding
structures were obtained without intravenous contrast.

[Series 3: DWI · axial · 3.0mm · 0.94mm/px · z∈[+10,+168]mm · 8 of 108 slices shown (1 of 2)]
[im 1/108]
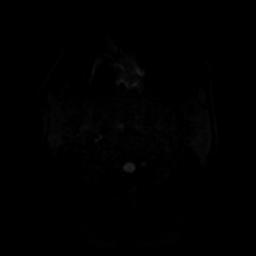
[im 12/108]
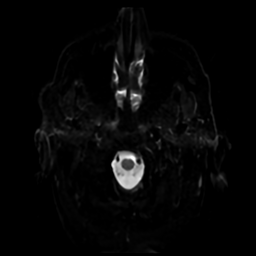
[im 36/108]
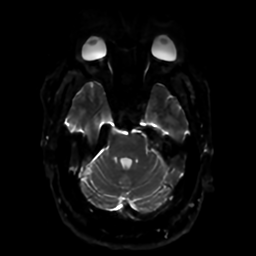
[im 48/108]
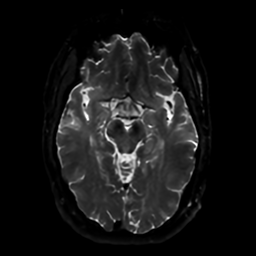
[im 60/108]
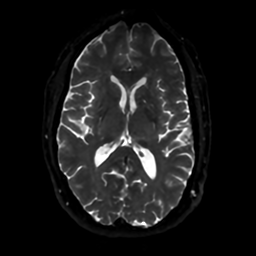
[im 72/108]
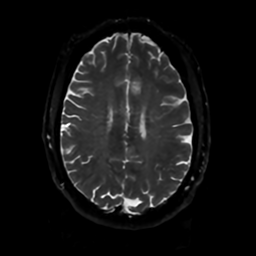
[im 96/108]
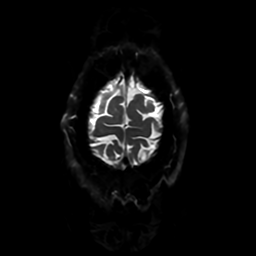
[im 108/108]
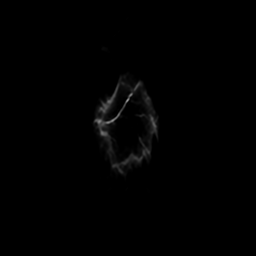

[Series 4: DWI · coronal · 4.0mm · 0.94mm/px · 7 of 78 slices shown (2 of 2)]
[im 1/78]
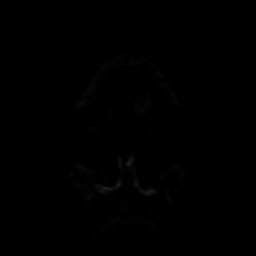
[im 13/78]
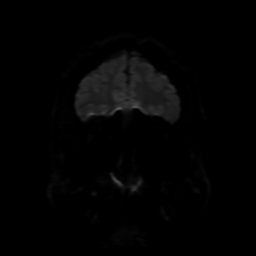
[im 26/78]
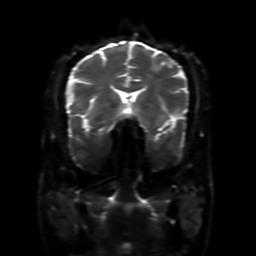
[im 39/78]
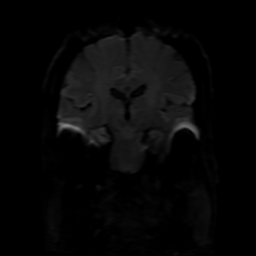
[im 52/78]
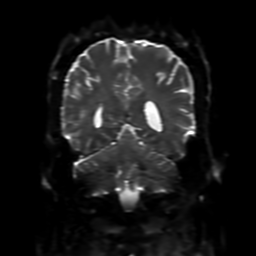
[im 65/78]
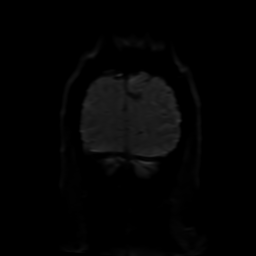
[im 78/78]
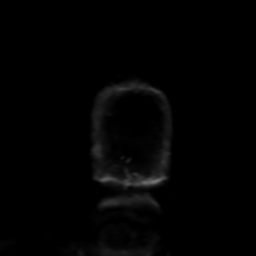

[Series 5: FLAIR · sagittal · 5.0mm · 0.23mm/px · 2 of 25 slices shown (1 of 2)]
[im 1/25]
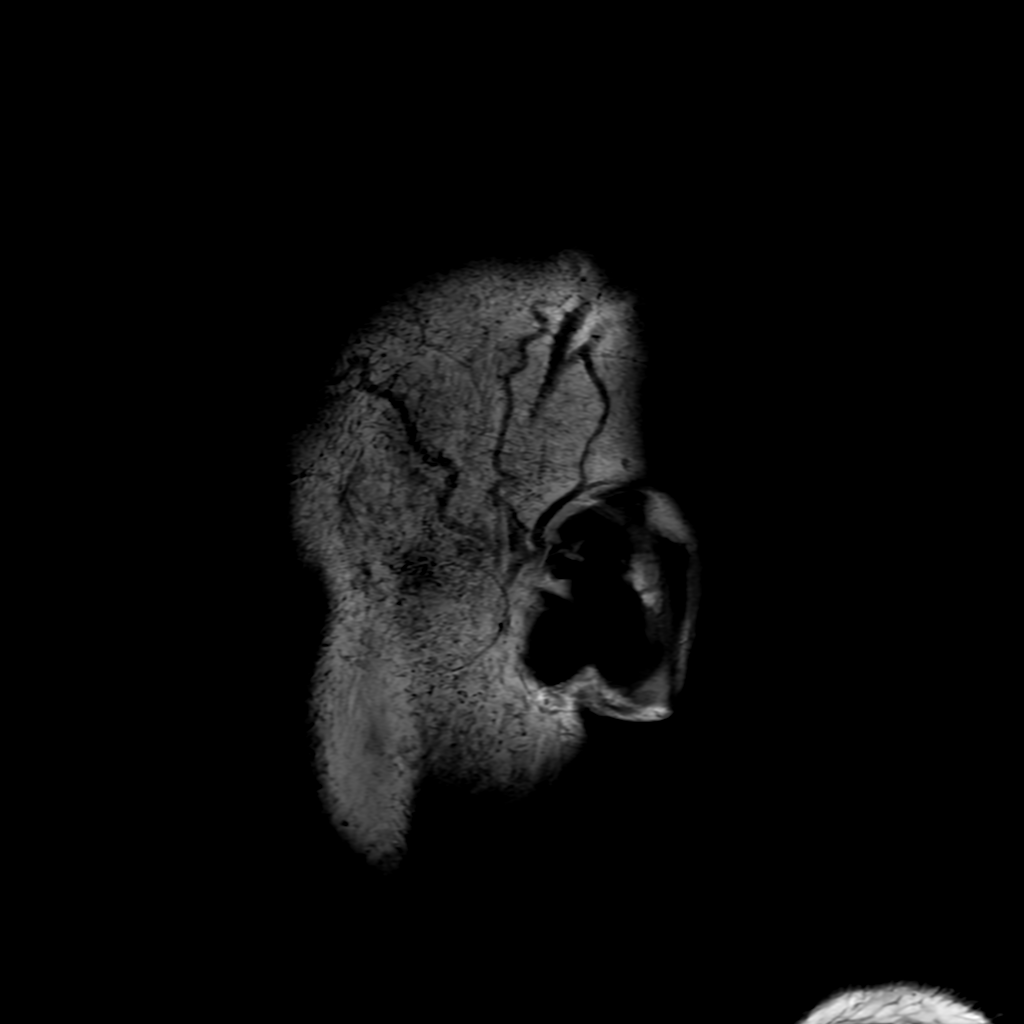
[im 25/25]
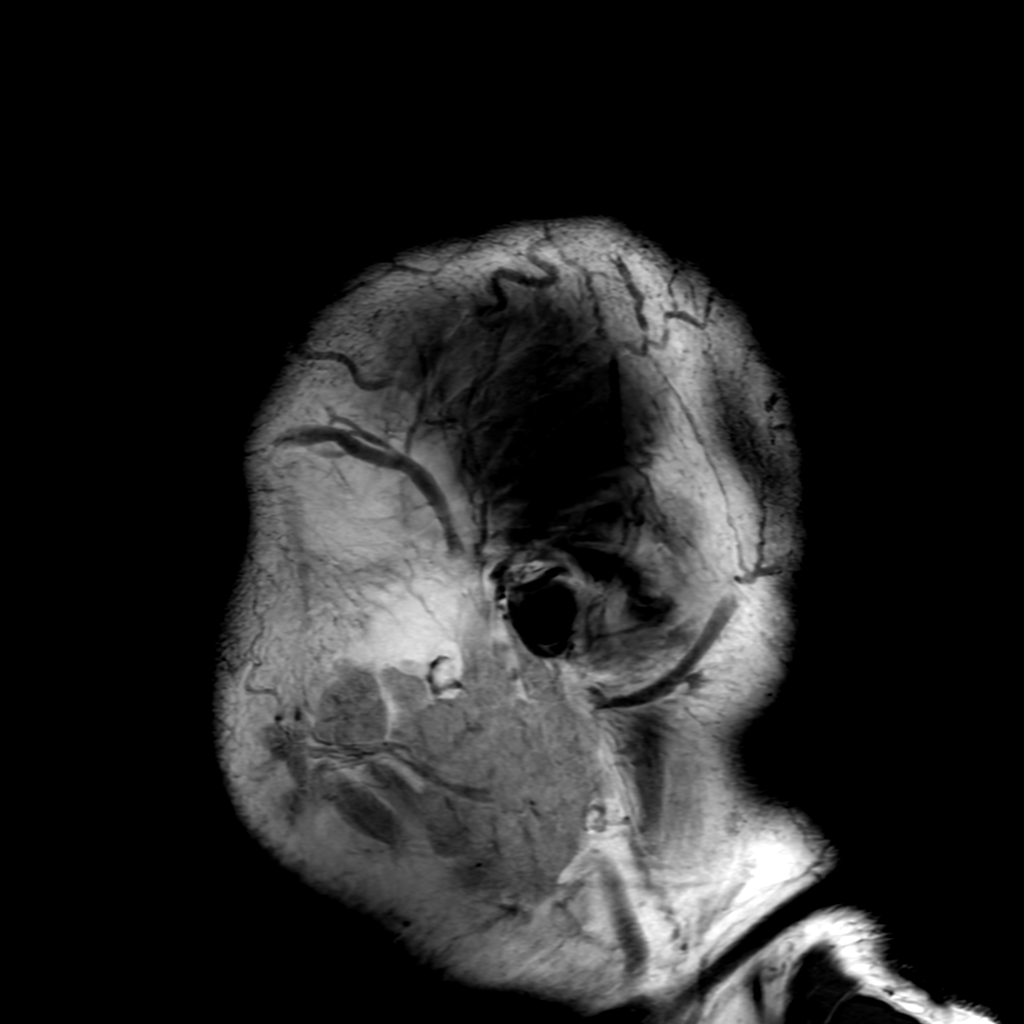

[Series 7: FLAIR · axial · 4.0mm · 0.45mm/px · z∈[+24,+172]mm · 3 of 35 slices shown (2 of 2)]
[im 1/35]
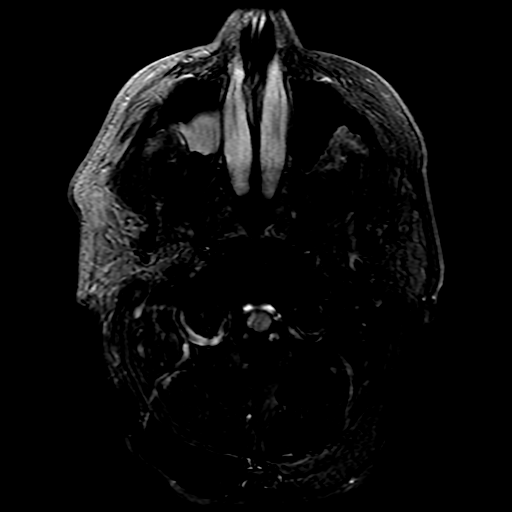
[im 18/35]
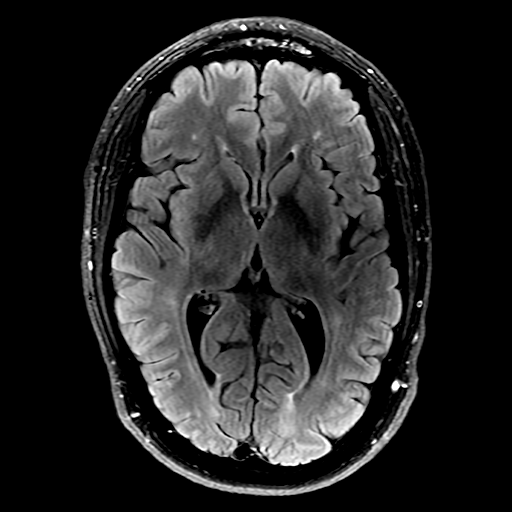
[im 35/35]
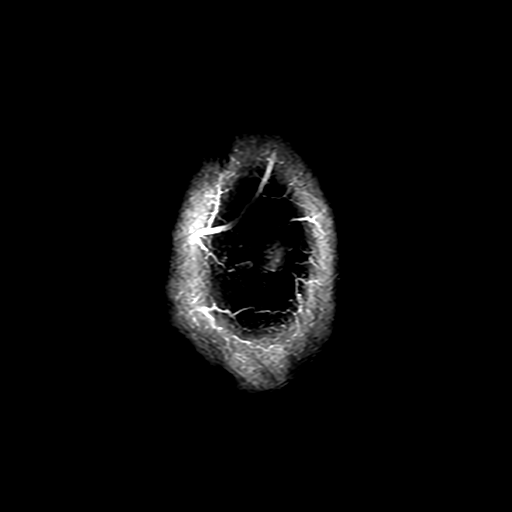

[Series 350: ADC · axial · 3.0mm · 0.94mm/px · z∈[+10,+168]mm · 4 of 52 slices shown (1 of 2)]
[im 1/52]
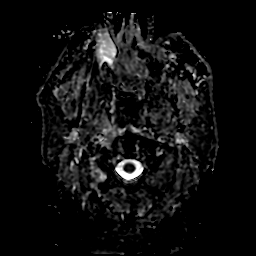
[im 18/52]
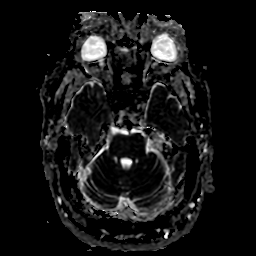
[im 35/52]
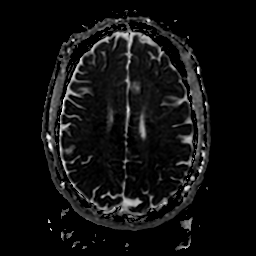
[im 52/52]
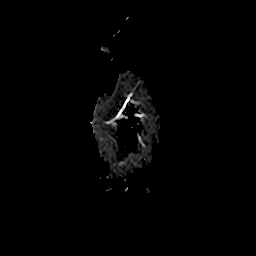

[Series 450: ADC · coronal · 4.0mm · 0.94mm/px · 3 of 39 slices shown (2 of 2)]
[im 1/39]
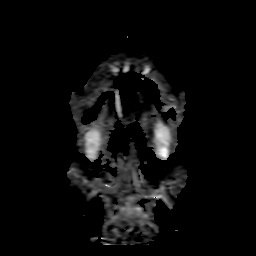
[im 20/39]
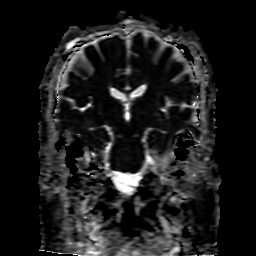
[im 39/39]
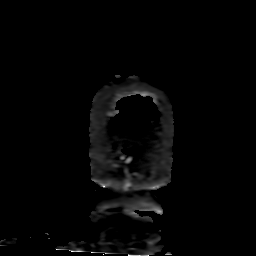

[27 of 48 positions shown; findings below may reference images not displayed]

FINDINGS: Brain: There is no acute infarction or intracranial hemorrhage.
There is no intracranial mass, mass effect, or edema. There is no
hydrocephalus or extra-axial fluid collection. Ventricles and sulci
are within normal limits in size and configuration. Minimal patchy
foci of T2 hyperintensity in the supratentorial white matter likely
reflect nonspecific gliosis/demyelination.

Vascular: Major vessel flow voids at the skull base are preserved.

Skull and upper cervical spine: Normal marrow signal is preserved.

Sinuses/Orbits: Paranasal sinus mucosal thickening. Orbits are
unremarkable.

Other: Sella is unremarkable.  Mastoid air cells are clear.
IMPRESSION: No acute infarction, hemorrhage, or mass.

Minimal foci of nonspecific gliosis/demyelination in the cerebral
white matter possibly reflecting chronic microvascular ischemic
changes.

## 2022-04-25 IMAGING — CT CT HEAD W/O CM
4 series · 16 of 47 positions shown, 18 images · non-contrast
Comparison: None Available.

CLINICAL DATA: Sudden onset of left neck, arm and leg tingling.



[Series 3: head without · axial · non-contrast · 0.48mm/px · z∈[+459,+584]mm · 7 of 35 slices shown, 9 images]
[im 5/35  brain]
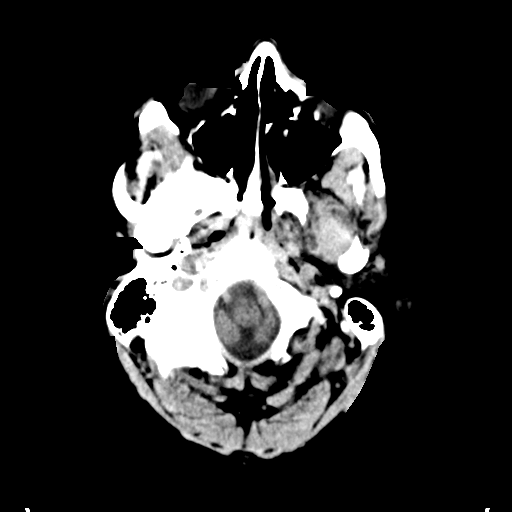
[im 5/35  bone]
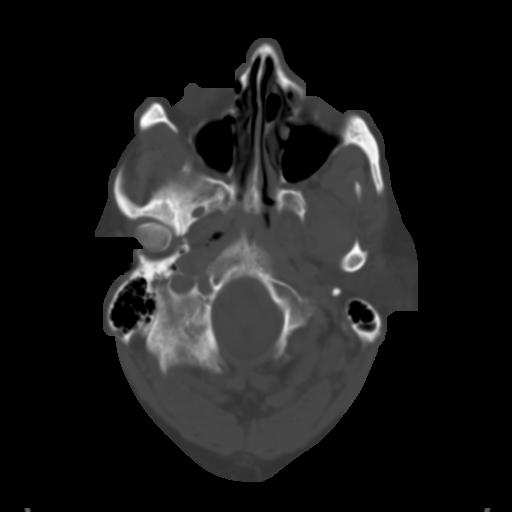
[im 9/35  brain]
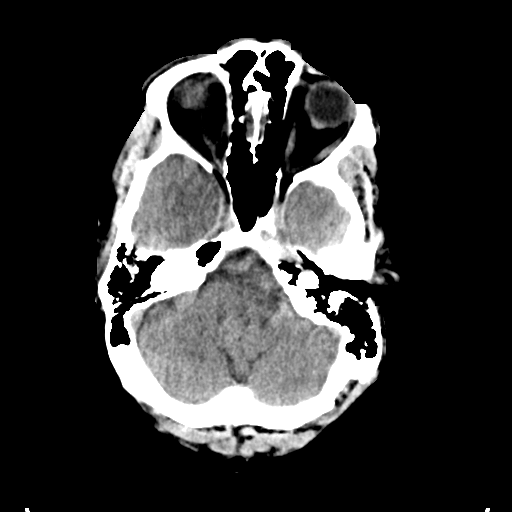
[im 13/35  brain]
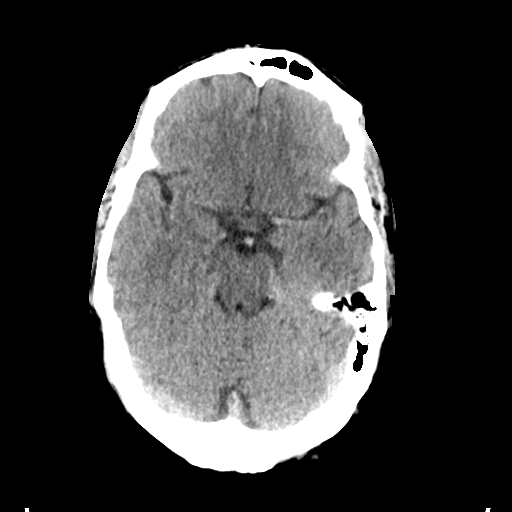
[im 18/35  brain]
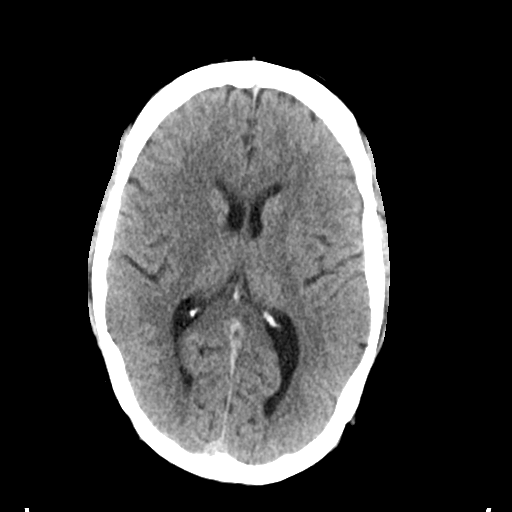
[im 22/35  brain]
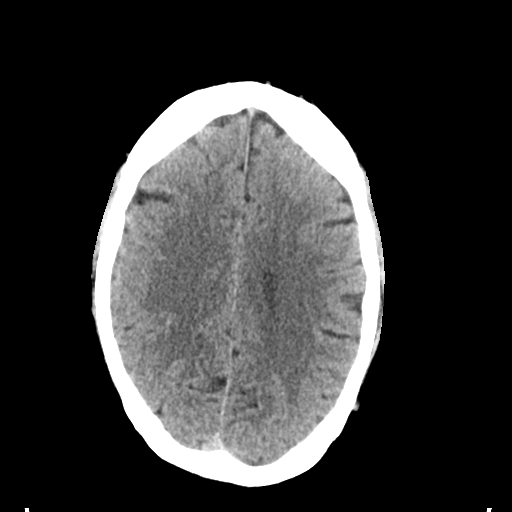
[im 22/35  bone]
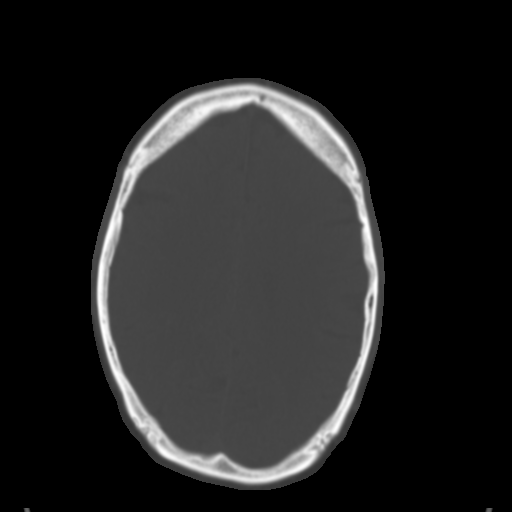
[im 26/35  brain]
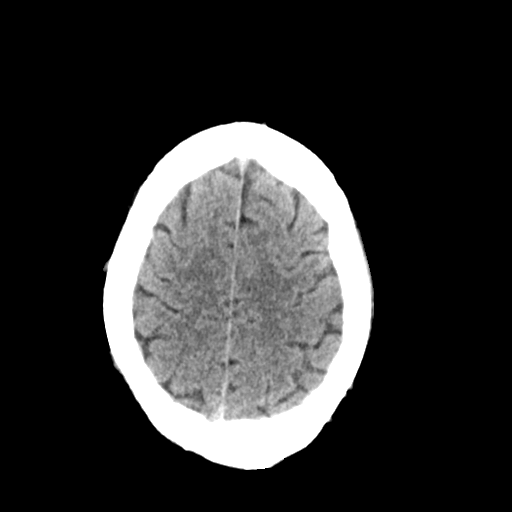
[im 30/35  brain]
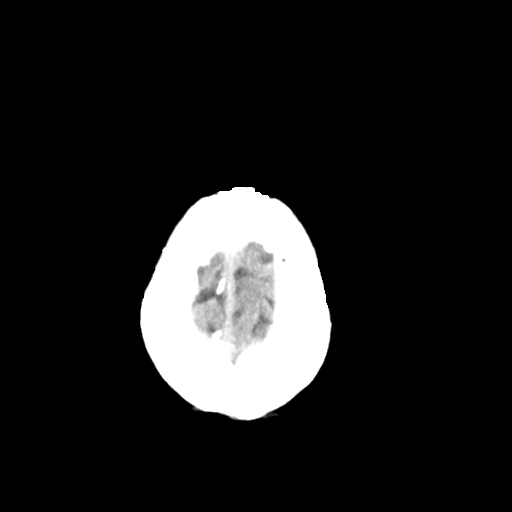

[Series 4: ax head bone · axial · 0.38mm/px · z∈[+449,+482]mm · 3 of 86 slices shown]
[im 9/86  bone]
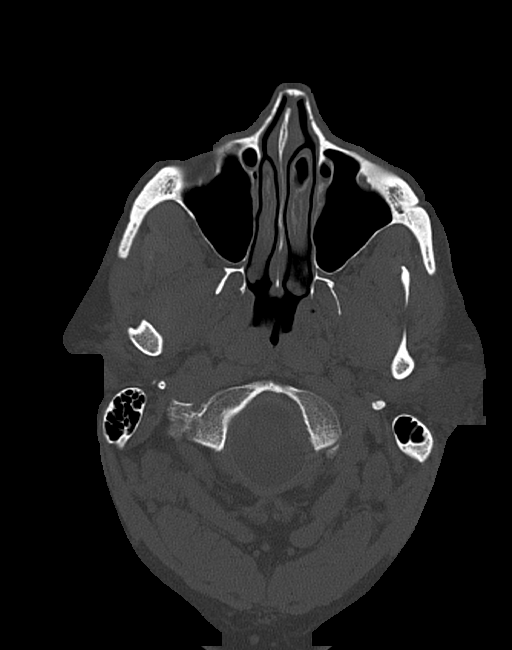
[im 18/86  bone]
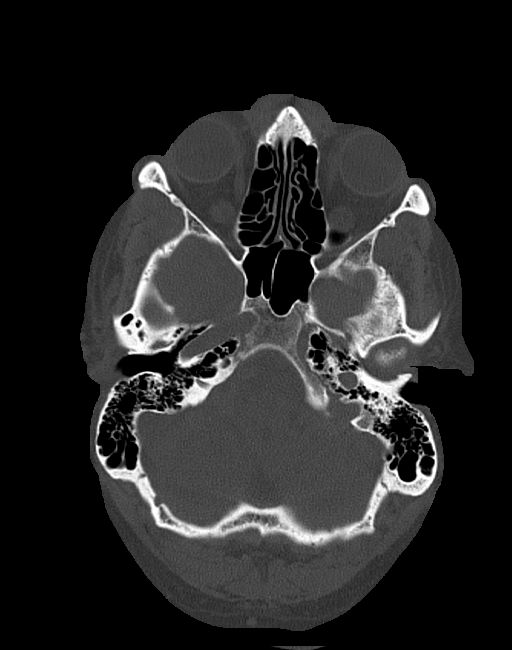
[im 26/86  bone]
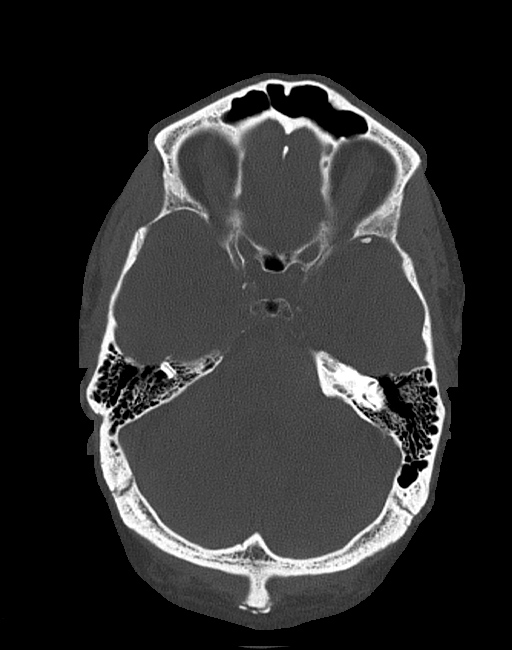

[Series 5: head without cor · coronal · non-contrast · 0.33mm/px · 3 of 69 slices shown]
[im 23/69  brain]
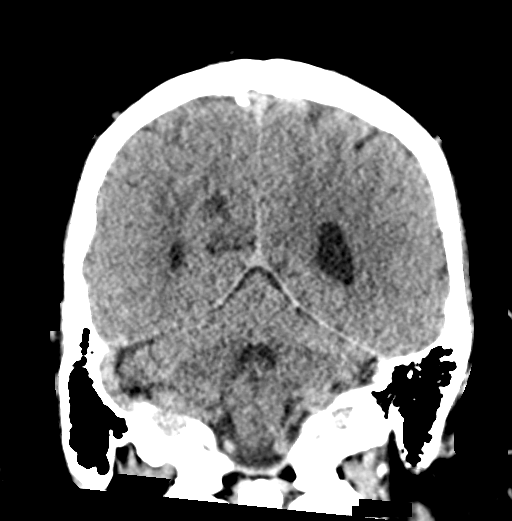
[im 31/69  brain]
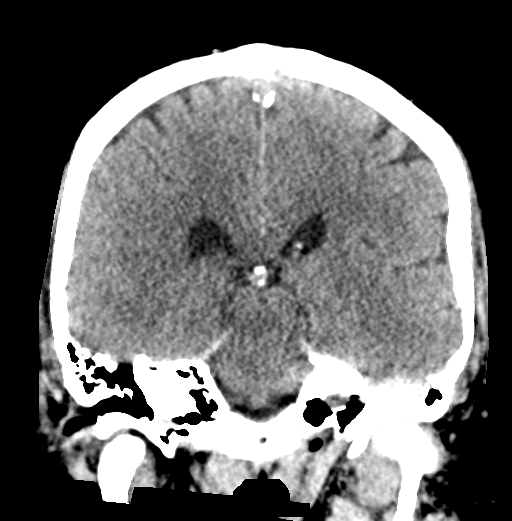
[im 38/69  brain]
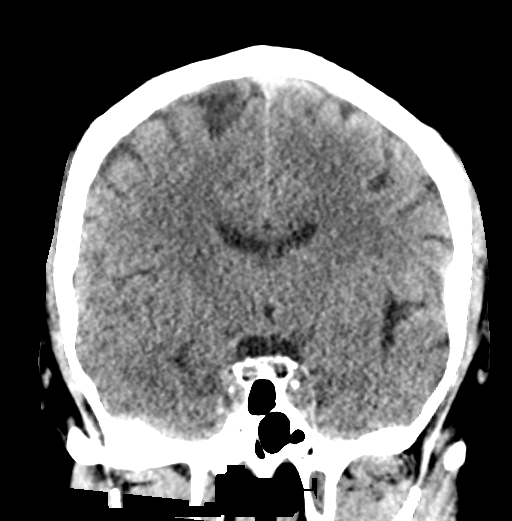

[Series 6: head without sag · sagittal · non-contrast · 0.34mm/px · 3 of 52 slices shown]
[im 18/52  brain]
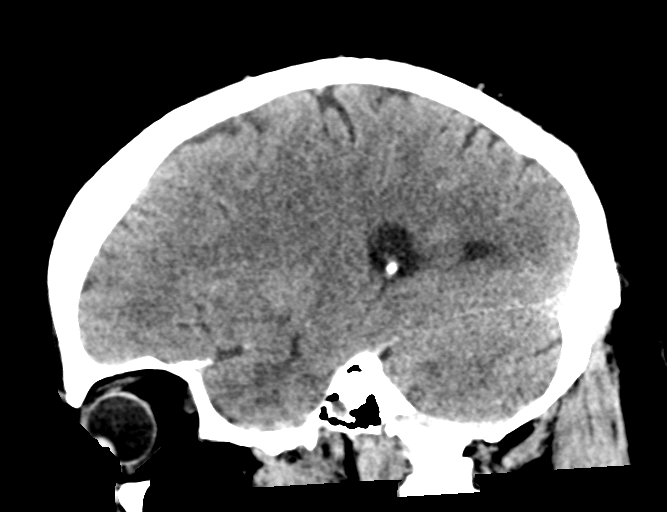
[im 26/52  brain]
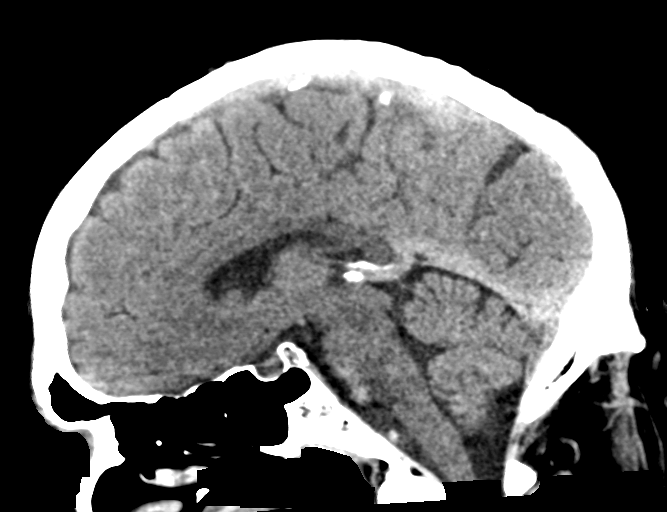
[im 35/52  brain]
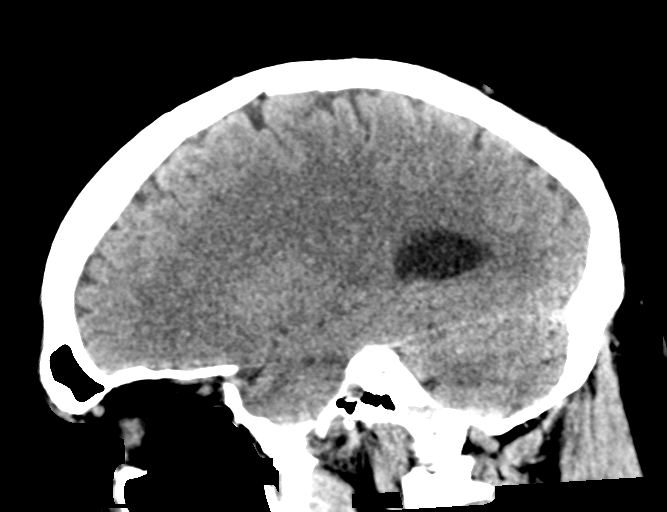

[16 of 47 positions shown; findings below may reference images not displayed]

FINDINGS: Brain: No evidence of acute infarction, hemorrhage, hydrocephalus,
extra-axial collection or mass lesion/mass effect.

Vascular: No hyperdense vessel or unexpected calcification.

Skull: Normal. Negative for fracture or focal lesion.

Sinuses/Orbits: Globes and orbits are unremarkable. Visualized
sinuses are clear.

Other: None.
IMPRESSION: Normal unenhanced CT scan of the brain.

## 2022-04-25 MED ORDER — CALCIUM GLUCONATE-NACL 1-0.675 GM/50ML-% IV SOLN
1.0000 g | Freq: Once | INTRAVENOUS | Status: AC
  Administered 2022-04-25: 1000 mg via INTRAVENOUS
  Filled 2022-04-25: qty 50

## 2022-04-25 MED ORDER — LORAZEPAM 1 MG PO TABS
1.0000 mg | ORAL_TABLET | Freq: Once | ORAL | Status: DC | PRN
  Filled 2022-04-25: qty 1

## 2022-04-25 MED ORDER — SODIUM ZIRCONIUM CYCLOSILICATE 10 G PO PACK
10.0000 g | PACK | Freq: Once | ORAL | Status: AC
  Administered 2022-04-25: 10 g via ORAL
  Filled 2022-04-25: qty 1

## 2022-04-25 MED ORDER — SODIUM CHLORIDE 0.9 % IV BOLUS
1000.0000 mL | Freq: Once | INTRAVENOUS | Status: AC
  Administered 2022-04-25: 1000 mL via INTRAVENOUS

## 2022-04-25 NOTE — ED Notes (Signed)
Pt transported to Xray via stretcher

## 2022-04-25 NOTE — ED Notes (Signed)
Dr. Lawsing at bedside assessing pt at this time.  °

## 2022-04-25 NOTE — ED Notes (Signed)
PA Christian notified of I-stat chem 8 results. OK to wait for confirmation of K+ from CMP sent to lab.

## 2022-04-25 NOTE — ED Notes (Signed)
Pt now reporting feeling "foggy" since waking this AM. LKW last night around 2115 when he went to bed.

## 2022-04-25 NOTE — ED Triage Notes (Signed)
Pt BIB [facility] from home C/O sudden onset of L neck, arm, leg and tingling in lips at 1210. PA at bedside during triage assessing pt.

## 2022-04-25 NOTE — ED Provider Notes (Signed)
[facility] Provider Note   CSN: [id number] Arrival date & time: 04/25/22  1329     History  Chief Complaint  Patient presents with   Numbness    Steven Mcclain is a 56 y.o. male with past medical history significant for hyperlipidemia, hypertension who presents with concern for sudden onset left neck, arm, leg numbness, tingling, heaviness as well as tingling, numbness in the lips that started when he woke up at around 4 AM this morning.  Last known well was around 9 PM last night.  On EMS arrival patient with elevated blood pressure, systolic 230, diastolic 120.  Patient denies any chest pain, shortness of breath.  Patient reports that he had some resolution of the left arm heaviness, left leg heaviness.  He reports that he felt some balance difficulties secondary to the heaviness in his extremities, but has not felt any dizziness.  He denies vision changes, diplopia.  He denies any confusion urinary or fecal incontinence, speech difficulties, dysarthria.  No previous history of stroke, ACS.  Patient does not take any blood thinners.  Patient did recently have lumbar back injection, and just completed a 6-day course of steroids.  HPI     Home Medications Prior to Admission medications   Medication Sig Start Date End Date Taking? Authorizing Provider  acetaminophen (TYLENOL) 500 MG tablet Take 1,000 mg by mouth 3 (three) times daily as needed (back pain).   Yes [provider]  albuterol (VENTOLIN HFA) 108 (90 Base) MCG/ACT inhaler Inhale 2 puffs into the lungs every 4 (four) hours as needed for wheezing or shortness of breath. Patient taking differently: Inhale 2 puffs into the lungs as needed for wheezing or shortness of breath. 06/28/21  Yes Valinda HoarWhite, Adrienne R, NP  aspirin 81 MG tablet Take 81 mg by mouth daily.   Yes [provider]  B Complex Vitamins (B COMPLEX PO) Take 1 tablet by mouth daily.   Yes [provider]  Calcium Carbonate Antacid (TUMS PO) Take 1 tablet by mouth as needed (acid reflux).   Yes [provider]  cyclobenzaprine (FLEXERIL) 10 MG tablet Take 1 tablet (10 mg total) by mouth 2 (two) times daily as needed for muscle spasms. Patient taking differently: Take 10 mg by mouth at bedtime. 04/20/22  Yes Boscia, Kathlynn GrateHeather E, NP  diclofenac (VOLTAREN) 50 MG EC tablet Take 1 tablet (50 mg total) by mouth 2 (two) times daily. Patient taking differently: Take 50 mg by mouth at bedtime. 04/20/22  Yes Boscia, Kathlynn GrateHeather E, NP  ibuprofen (ADVIL) 200 MG tablet Take 200 mg by mouth as needed (pain).   Yes [provider]  Multiple Vitamin (MULTI VITAMIN DAILY) TABS Take 1 tablet by mouth daily.   Yes [provider]  naproxen sodium (ALEVE) 220 MG tablet Take 220 mg by mouth once.   Yes [provider]  Omeprazole 20 MG TBDD Take 1 capsule by mouth daily. Patient taking differently: Take 20 mg by mouth daily. 04/20/22  Yes Boscia, Kathlynn GrateHeather E, NP  OVER THE COUNTER MEDICATION Apply 1 Application topically 3 (three) times daily as needed (itching). Benadryl cream ex   Yes [provider]  predniSONE (STERAPRED UNI-PAK 21 TAB) 10 MG (21) TBPK tablet 6 day taper - take by mouth as directed for 6 days 04/20/22  Yes Boscia, Kathlynn GrateHeather E, NP  tadalafil (CIALIS) 20 MG tablet Take 1 tablet po prn Patient taking differently: Take 10 mg by mouth See  admin instructions. Take 10 mg by mouth every three days 04/20/22  Yes Boscia, Heather E, NP  VITAMIN D PO Take 1 tablet by mouth daily.   Yes [provider]      Allergies    Patient has no known allergies.    Review of Systems   Review of Systems  Neurological:  Positive for weakness and numbness.  All other systems reviewed and are negative.   Physical Exam Updated Vital Signs BP (!) 163/114   Pulse 76   Temp 98.6 F (37 C) (Oral)   Resp 17   Ht 6\' 2"  (1.88 m)   Wt 104.3 kg   SpO2 93%   BMI 29.53 kg/m   Physical Exam Vitals and nursing note reviewed.  Constitutional:      General: He is not in acute distress.    Appearance: Normal appearance.  HENT:     Head: Normocephalic and atraumatic.  Eyes:     General:        Right eye: No discharge.        Left eye: No discharge.  Cardiovascular:     Rate and Rhythm: Normal rate and regular rhythm.     Heart sounds: No murmur heard.    No friction rub. No gallop.  Pulmonary:     Effort: Pulmonary effort is normal.     Breath sounds: Normal breath sounds.  Abdominal:     General: Bowel sounds are normal.     Palpations: Abdomen is soft.  Skin:    General: Skin is warm and dry.     Capillary Refill: Capillary refill takes less than 2 seconds.  Neurological:     Mental Status: He is alert and oriented to person, place, and time.     Comments: Cranial nerves II through XII grossly intact.  Intact finger-nose, intact heel-to-shin.  Romberg negative, gait normal.  Alert and oriented x3.  Moves all 4 limbs spontaneously, normal coordination.  No pronator drift.  Intact strength 5 out of 5 bilateral Bissonette and lower extremities.  Patient does endorse some sensory deficit on the left arm compared to the right.  No notable facial droop, abnormal coordination, dysarthria.  Psychiatric:        Mood and Affect: Mood normal.        Behavior: Behavior normal.     ED Results / Procedures / Treatments   Labs (all labs ordered are listed, but only abnormal results are displayed) Labs Reviewed  CBC - Abnormal; Notable for the following components:      Result Value   WBC 14.2 (*)    MCHC 36.2 (*)    All other components within normal limits  DIFFERENTIAL - Abnormal; Notable for the following components:   Neutro Abs 10.3 (*)    Abs Immature Granulocytes 0.17 (*)    All other components within normal limits  COMPREHENSIVE METABOLIC PANEL - Abnormal; Notable for the following components:   Sodium 134 (*)    Potassium 6.1 (*)    Glucose, Bld 260  (*)    BUN 22 (*)    Calcium 8.2 (*)    Total Protein 5.6 (*)    AST 58 (*)    Total Bilirubin 1.3 (*)    All other components within normal limits  URINALYSIS, ROUTINE W REFLEX MICROSCOPIC - Abnormal; Notable for the following components:   APPearance HAZY (*)    Glucose, UA >=500 (*)    All other components within normal limits  CBG  MONITORING, ED - Abnormal; Notable for the following components:   Glucose-Capillary 226 (*)    All other components within normal limits  I-STAT CHEM 8, ED - Abnormal; Notable for the following components:   Sodium 133 (*)    Potassium 6.9 (*)    BUN 35 (*)    Glucose, Bld 272 (*)    Calcium, Ion 1.06 (*)    All other components within normal limits  RESP PANEL BY RT-PCR (FLU A&B, COVID) ARPGX2  ETHANOL  RAPID URINE DRUG SCREEN, HOSP PERFORMED  PROTIME-INR  APTT  MAGNESIUM  TROPONIN I (HIGH SENSITIVITY)  TROPONIN I (HIGH SENSITIVITY)    EKG EKG Interpretation  Date/Time:  Monday April 25 2022 13:43:18 EDT Ventricular Rate:  60 PR Interval:  168 QRS Duration: 95 QT Interval:  454 QTC Calculation: 454 R Axis:   25 Text Interpretation: Sinus rhythm Confirmed by Ernie Avena ([id number]) on 04/25/2022 1:54:13 PM  Radiology MR BRAIN WO CONTRAST  Result Date: 04/25/2022 CLINICAL DATA:  Neuro deficit, acute, stroke suspected EXAM: MRI HEAD WITHOUT CONTRAST TECHNIQUE: Multiplanar, multiecho pulse sequences of the brain and surrounding structures were obtained without intravenous contrast. COMPARISON:  None Available. FINDINGS: Brain: There is no acute infarction or intracranial hemorrhage. There is no intracranial mass, mass effect, or edema. There is no hydrocephalus or extra-axial fluid collection. Ventricles and sulci are within normal limits in size and configuration. Minimal patchy foci of T2 hyperintensity in the supratentorial white matter likely reflect nonspecific gliosis/demyelination. Vascular: Major vessel flow voids at the skull base are  preserved. Skull and [place] cervical spine: Normal marrow signal is preserved. Sinuses/Orbits: Paranasal sinus mucosal thickening. Orbits are unremarkable. Other: Sella is unremarkable.  Mastoid air cells are clear. IMPRESSION: No acute infarction, hemorrhage, or mass. Minimal foci of nonspecific gliosis/demyelination in the cerebral white matter possibly reflecting chronic microvascular ischemic changes. Electronically Signed   By: Guadlupe Spanish M.D.   On: 04/25/2022 18:51   DG Chest 2 View  Result Date: 04/25/2022 CLINICAL DATA:  Tingling left side of body EXAM: CHEST - 2 VIEW COMPARISON:  09/18/2003 FINDINGS: The heart size and mediastinal contours are within normal limits. Both lungs are clear. The visualized skeletal structures are unremarkable. IMPRESSION: No active cardiopulmonary disease. Electronically Signed   By: Ernie Avena M.D.   On: 04/25/2022 15:00   CT Head Wo Contrast  Result Date: 04/25/2022 CLINICAL DATA:  Sudden onset of left neck, arm and leg tingling. EXAM: CT HEAD WITHOUT CONTRAST TECHNIQUE: Contiguous axial images were obtained from the base of the skull through the vertex without intravenous contrast. RADIATION DOSE REDUCTION: This exam was performed according to the departmental dose-optimization program which includes automated exposure control, adjustment of the mA and/or kV according to patient size and/or use of iterative reconstruction technique. COMPARISON:  None Available. FINDINGS: Brain: No evidence of acute infarction, hemorrhage, hydrocephalus, extra-axial collection or mass lesion/mass effect. Vascular: No hyperdense vessel or unexpected calcification. Skull: Normal. Negative for fracture or focal lesion. Sinuses/Orbits: Globes and orbits are unremarkable. Visualized sinuses are clear. Other: None. IMPRESSION: Normal unenhanced CT scan of the brain. Electronically Signed   By: Amie Portland M.D.   On: 04/25/2022 14:42    Procedures Procedures    Medications  Ordered in ED Medications  LORazepam (ATIVAN) tablet 1 mg (has no administration in time range)  calcium gluconate 1 g/ 50 mL sodium chloride IVPB (1,000 mg Intravenous New Bag/Given 04/25/22 1715)  sodium chloride 0.9 % bolus 1,000 mL (1,000 mLs Intravenous  New Bag/Given 04/25/22 1717)  sodium zirconium cyclosilicate (LOKELMA) packet 10 g (10 g Oral Given 04/25/22 1655)    ED Course/ Medical Decision Making/ A&P Clinical Course as of 04/25/22 1858  Mon Apr 25, 2022  1412 Potassium(!!): 6.9 High suspicion for hemolysis, we will wait for cmp before addressing. No acute EKG changes to suggest acute hyperkalemia. [CP]  1602 Blackstrap molasses in coffee every day -- reports he knows it is high in potassium [CP]    Clinical Course User Index [CP] Ha Placeres, Harrel Carina, PA-C                           Medical Decision Making Amount and/or Complexity of Data Reviewed Labs: ordered. Decision-making details documented in ED Course. Radiology: ordered.  Risk Prescription drug management.   This patient is a 56 y.o. male who presents to the ED for concern of left arm tingling, numbness, heaviness, hypertension, this involves an extensive number of treatment options, and is a complaint that carries with it a high risk of complications and morbidity. The emergent differential diagnosis prior to evaluation includes, but is not limited to, stroke, TIA, ACS, hypertensive urgency, emergency, cervical spine pathology, versus other.   This is not an exhaustive differential.   Past Medical History / Co-morbidities / Social History: Hyperlipidemia, hypertension  Additional history: Chart reviewed. Pertinent results include: Reviewed lab work, imaging from recent outpatient feeling medicine visits, previous urgent care visits  Physical Exam: Physical exam performed. The pertinent findings include: Patient with some sensory deficits to the left [place] extremity on my exam.  Otherwise unremarkable  neurologic exam at this time.  Numbness of the arm worse with standing for patient.  No balance deficits.  Lab Tests: I ordered, and personally interpreted labs.  The pertinent results include: Patient with moderate hyperkalemia, potassium 6.1, no significant EKG changes noted.  He does have some hyperglycemia glucose 260, no previous history of diabetes.  He did recently complete a course of steroids.  Patient reports that his hypokalemia is likely secondary to the blackstrap molasses that he puts in his coffee every day as he knows that is high in potassium.  No previous history of hyperkalemia.  Patient does have a mild leukocytosis white blood cells of 14.2, this could also be secondary to patient's recent steroid use as he has no significant infectious symptoms.  Urinalysis with history of glucose, no evidence of UTI.  RVP negative for COVID, flu.  Normal APTT, PT/INR, magnesium.  Normal troponin x2.  No evidence of acute heart abnormality in setting of patient's severe hypertension on arrival.   Imaging Studies: I ordered imaging studies including plain film chest x-ray, CT head without contrast. I independently visualized and interpreted imaging which showed no evidence of intrathoracic abnormality, no evidence of intracranial abnormality on CT head without contrast.  As patient has symptoms that are concerning for possible stroke versus TIA we will proceed to MR brain without contrast. I agree with the radiologist interpretation.  MR brain shows chronic microvascular changes without any evidence of acute infarct today.   Cardiac Monitoring:  The patient was maintained on a cardiac monitor.  My attending physician Dr. Karene Fry viewed and interpreted the cardiac monitored which showed an underlying rhythm of: NSR. I agree with this interpretation.   Medications: I ordered medication including fluid bolus, Lokelma for hyperkalemia, calcium gluconate for hyperkalemia.  Patient will need outpatient  reevaluation versus repeat labs if admitted for resolving hyperkalemia.  At this time he shows no EKG changes we will not more aggressively replete.Marland Kitchen  6:58 PM Care of Steven Mcclain transferred to Dr. Audley Hose at the end of my shift as the patient will require reassessment once labs/imaging have resulted. Patient presentation, ED course, and plan of care discussed with review of all pertinent labs and imaging. Please see his/her note for further details regarding further ED course and disposition. Plan at time of handoff is neuro for work-up of potential TIA symptoms/hypertensive urgency/emergency, discussed whether TIA work-up is necessary in an inpatient basis today versus outpatient work-up. This may be altered or completely changed at the discretion of the oncoming team pending results of further workup.   Final Clinical Impression(s) / ED Diagnoses Final diagnoses:  None    Rx / DC Orders ED Discharge Orders     None         [place] 04/25/22 1859    Ernie Avena, MD 04/25/22 2139

## 2022-04-26 ENCOUNTER — Observation Stay (HOSPITAL_COMMUNITY): Payer: 59

## 2022-04-26 ENCOUNTER — Observation Stay (HOSPITAL_BASED_OUTPATIENT_CLINIC_OR_DEPARTMENT_OTHER): Payer: 59

## 2022-04-26 ENCOUNTER — Encounter (HOSPITAL_COMMUNITY): Payer: Self-pay | Admitting: Family Medicine

## 2022-04-26 DIAGNOSIS — E875 Hyperkalemia: Secondary | ICD-10-CM | POA: Diagnosis present

## 2022-04-26 DIAGNOSIS — G459 Transient cerebral ischemic attack, unspecified: Secondary | ICD-10-CM

## 2022-04-26 DIAGNOSIS — R7401 Elevation of levels of liver transaminase levels: Secondary | ICD-10-CM

## 2022-04-26 DIAGNOSIS — R030 Elevated blood-pressure reading, without diagnosis of hypertension: Secondary | ICD-10-CM | POA: Diagnosis not present

## 2022-04-26 DIAGNOSIS — D72829 Elevated white blood cell count, unspecified: Secondary | ICD-10-CM | POA: Insufficient documentation

## 2022-04-26 DIAGNOSIS — R29818 Other symptoms and signs involving the nervous system: Secondary | ICD-10-CM

## 2022-04-26 DIAGNOSIS — R739 Hyperglycemia, unspecified: Secondary | ICD-10-CM

## 2022-04-26 LAB — CBC
HCT: 42 % (ref 39.0–52.0)
Hemoglobin: 15.5 g/dL (ref 13.0–17.0)
MCH: 33.3 pg (ref 26.0–34.0)
MCHC: 36.9 g/dL — ABNORMAL HIGH (ref 30.0–36.0)
MCV: 90.1 fL (ref 80.0–100.0)
Platelets: 252 10*3/uL (ref 150–400)
RBC: 4.66 MIL/uL (ref 4.22–5.81)
RDW: 13.7 % (ref 11.5–15.5)
WBC: 9 10*3/uL (ref 4.0–10.5)
nRBC: 0 % (ref 0.0–0.2)

## 2022-04-26 LAB — COMPREHENSIVE METABOLIC PANEL
ALT: 62 U/L — ABNORMAL HIGH (ref 0–44)
AST: 61 U/L — ABNORMAL HIGH (ref 15–41)
Albumin: 3.2 g/dL — ABNORMAL LOW (ref 3.5–5.0)
Alkaline Phosphatase: 37 U/L — ABNORMAL LOW (ref 38–126)
Anion gap: 11 (ref 5–15)
BUN: 19 mg/dL (ref 6–20)
CO2: 22 mmol/L (ref 22–32)
Calcium: 8.3 mg/dL — ABNORMAL LOW (ref 8.9–10.3)
Chloride: 102 mmol/L (ref 98–111)
Creatinine, Ser: 1.19 mg/dL (ref 0.61–1.24)
GFR, Estimated: >60 mL/min (ref >60)
Glucose, Bld: 317 mg/dL — ABNORMAL HIGH (ref 70–99)
Potassium: 3.8 mmol/L (ref 3.5–5.1)
Sodium: 135 mmol/L (ref 135–145)
Total Bilirubin: 1 mg/dL (ref 0.3–1.2)
Total Protein: 5.7 g/dL — ABNORMAL LOW (ref 6.5–8.1)

## 2022-04-26 LAB — LIPID PANEL
Cholesterol: 242 mg/dL — ABNORMAL HIGH (ref 0–200)
HDL: 36 mg/dL — ABNORMAL LOW (ref >40)
LDL Cholesterol: UNDETERMINED mg/dL (ref 0–99)
Total CHOL/HDL Ratio: 6.7 RATIO
Triglycerides: 558 mg/dL — ABNORMAL HIGH (ref <150)
VLDL: UNDETERMINED mg/dL (ref 0–40)

## 2022-04-26 LAB — CBG MONITORING, ED
Glucose-Capillary: 296 mg/dL — ABNORMAL HIGH (ref 70–99)
Glucose-Capillary: 274 mg/dL — ABNORMAL HIGH (ref 70–99)
Glucose-Capillary: 220 mg/dL — ABNORMAL HIGH (ref 70–99)

## 2022-04-26 LAB — ECHOCARDIOGRAM COMPLETE
AR max vel: 2.48 cm2
AV Peak grad: 7.1 mmHg
Ao pk vel: 1.33 m/s
Area-P 1/2: 3.83 cm2
Calc EF: 58.8 %
Height: 74 in
S' Lateral: 3.1 cm
Single Plane A2C EF: 61.6 %
Single Plane A4C EF: 58.3 %
Weight: 3680 oz

## 2022-04-26 LAB — HEMOGLOBIN A1C
Hgb A1c MFr Bld: 7.1 % — ABNORMAL HIGH (ref 4.8–5.6)
Mean Plasma Glucose: 157.07 mg/dL

## 2022-04-26 LAB — HIV ANTIBODY (ROUTINE TESTING W REFLEX): HIV Screen 4th Generation wRfx: NONREACTIVE

## 2022-04-26 LAB — LDL CHOLESTEROL, DIRECT: Direct LDL: 134.7 mg/dL — ABNORMAL HIGH (ref 0–99)

## 2022-04-26 IMAGING — MR MR MRA HEAD W/O CM
1 series · 19 of 48 positions shown · non-contrast
Comparison: No prior MRA, correlation is made with MRI [DATE]

CLINICAL DATA: Sudden onset left neck, arm, and leg numbness; no
acute infarct on MRI

EXAM:
MRA HEAD WITHOUT CONTRAST
TECHNIQUE: Angiographic images of the Circle of Willis were acquired using MRA
technique without intravenous contrast.

[Series 5: 3d cow · axial · 0.5mm · 0.41mm/px · z∈[-31,+58]mm · 19 of 188 slices shown]
[im 1/188]
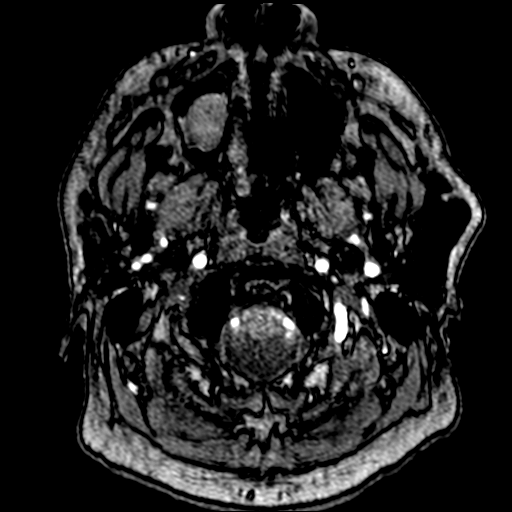
[im 4/188]
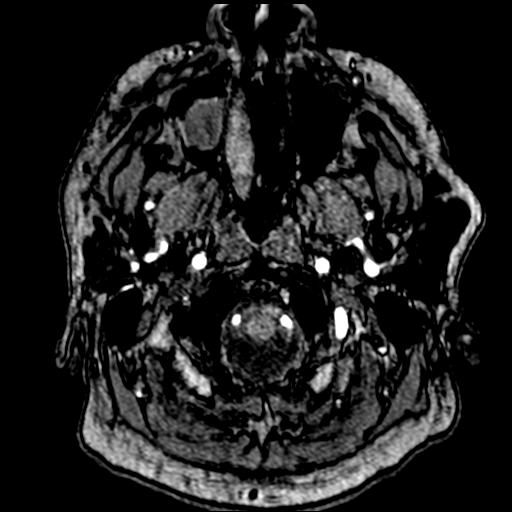
[im 8/188]
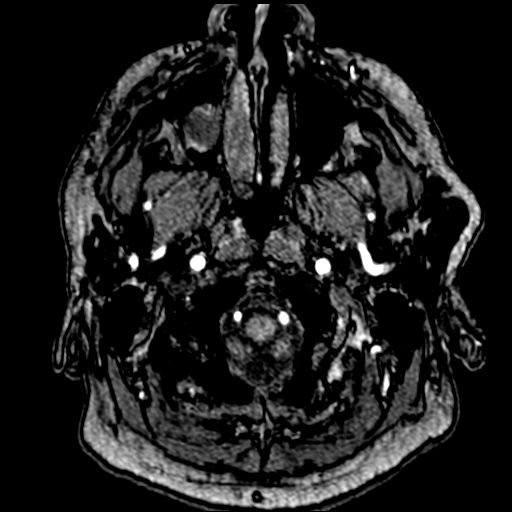
[im 12/188]
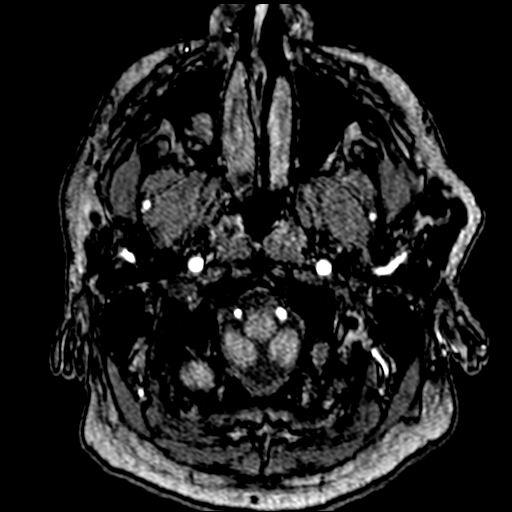
[im 16/188]
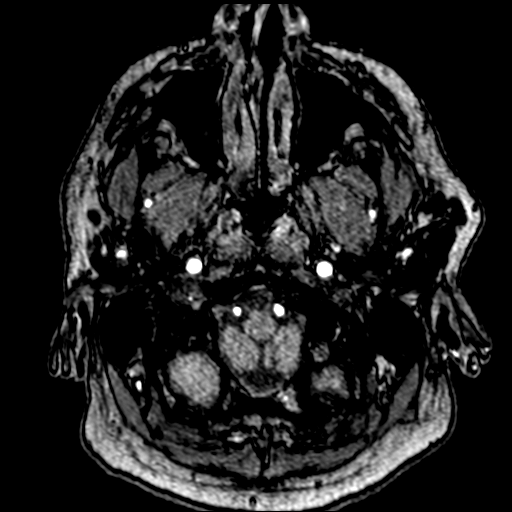
[im 20/188]
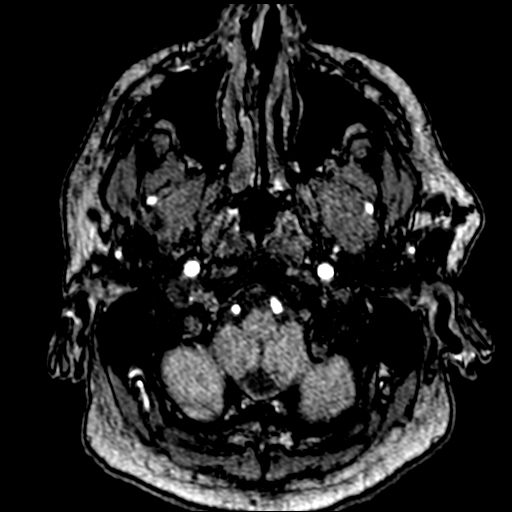
[im 24/188]
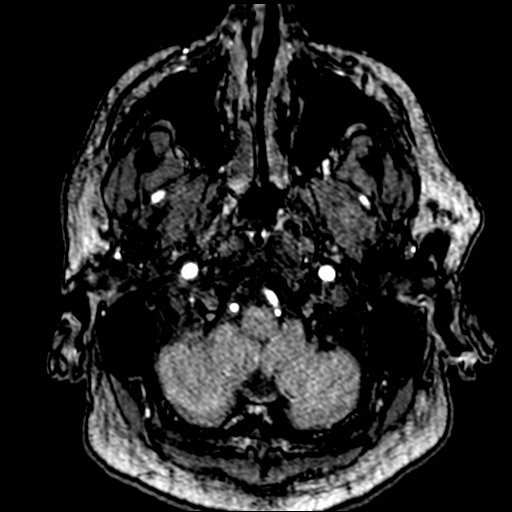
[im 28/188]
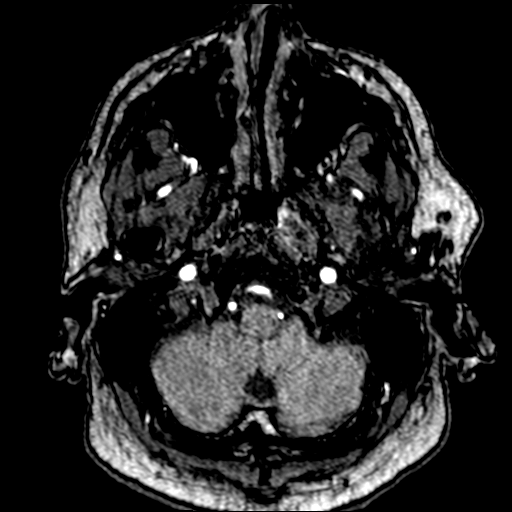
[im 32/188]
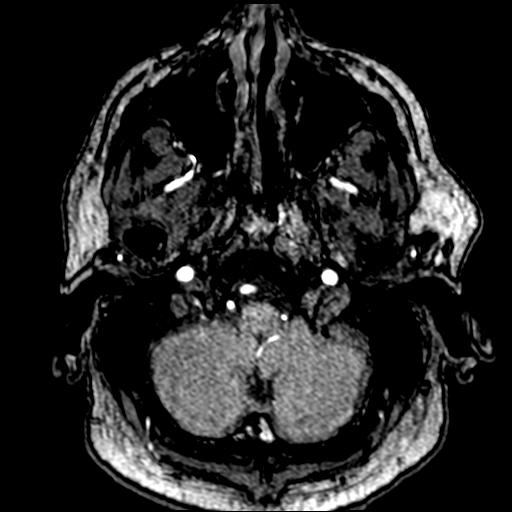
[im 36/188]
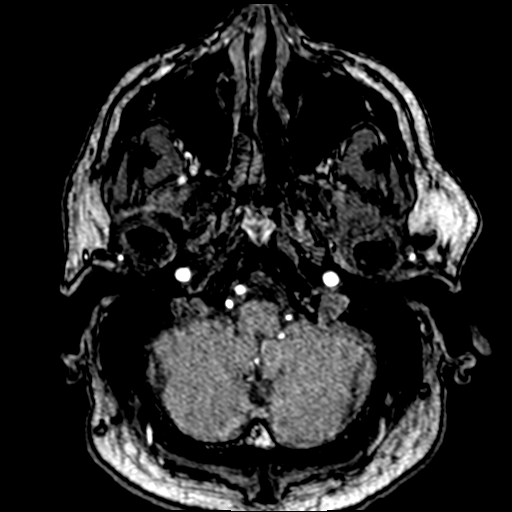
[im 40/188]
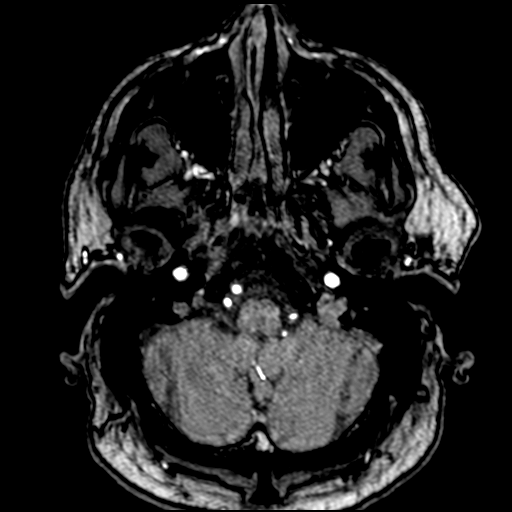
[im 60/188]
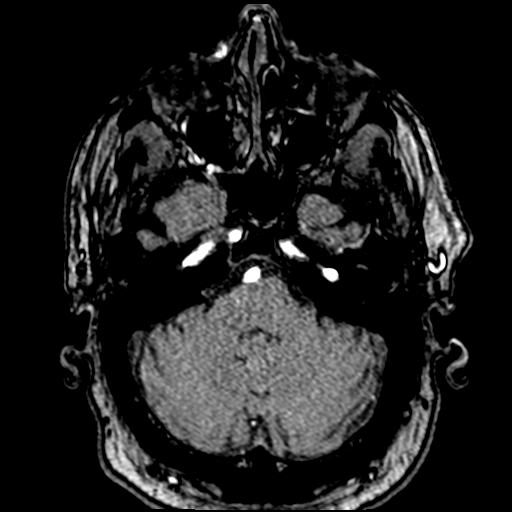
[im 84/188]
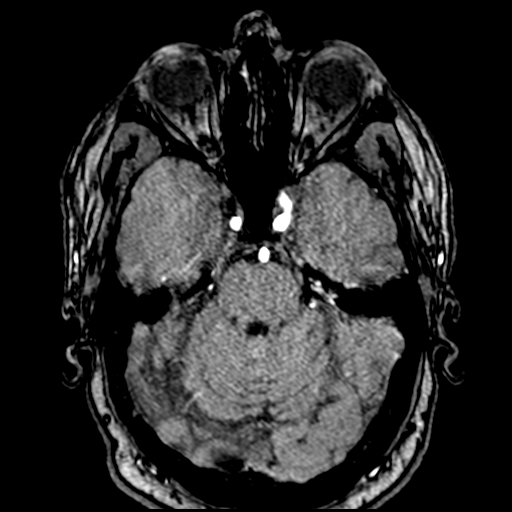
[im 96/188]
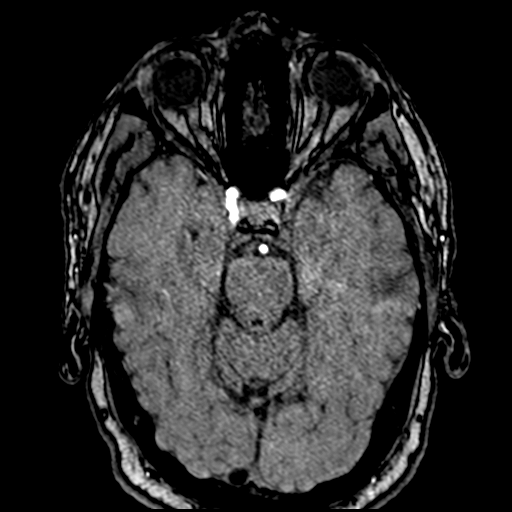
[im 108/188]
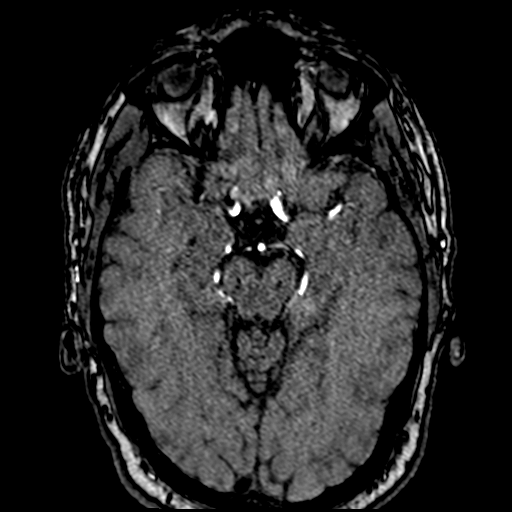
[im 132/188]
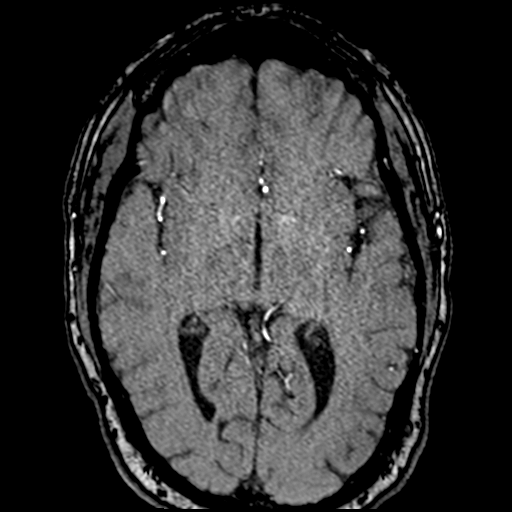
[im 156/188]
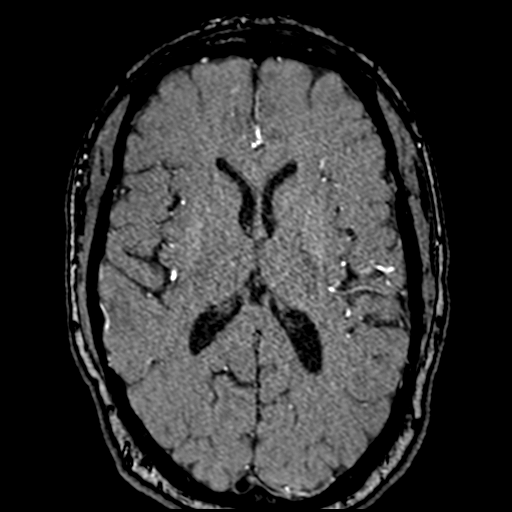
[im 160/188]
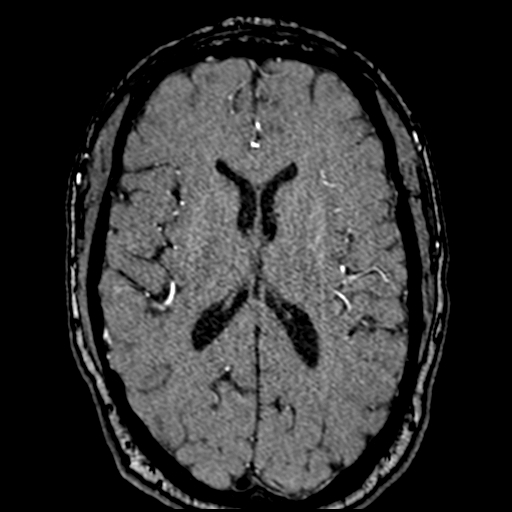
[im 180/188]
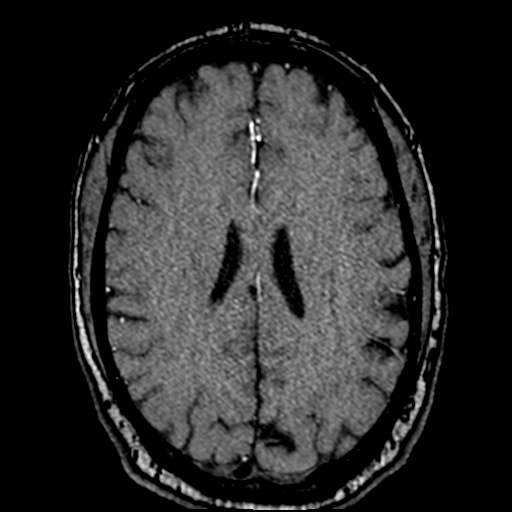

[19 of 48 positions shown; findings below may reference images not displayed]

FINDINGS: Anterior circulation: Both internal carotid arteries are patent to
the termini, without significant stenosis.

A1 segments patent. Normal anterior communicating artery. Anterior
cerebral arteries are patent to their distal aspects.

No M1 stenosis or occlusion. Normal MCA bifurcations. Distal MCA
branches perfused and symmetric.

Posterior circulation: Vertebral arteries patent to the
vertebrobasilar junction without stenosis. Basilar patent to its
distal aspect. Superior cerebellar arteries patent bilaterally.

Patent P1 segments. PCAs perfused to their distal aspects without
stenosis. The right posterior communicating artery is visualized.

Anatomic variants: None significant
IMPRESSION: No intracranial large vessel occlusion or significant stenosis.

## 2022-04-26 MED ORDER — ENOXAPARIN SODIUM 40 MG/0.4ML IJ SOSY: 40.0000 mg | PREFILLED_SYRINGE | Freq: Every day | INTRAMUSCULAR | Status: DC

## 2022-04-26 MED ORDER — ATORVASTATIN CALCIUM 40 MG PO TABS: 80.0000 mg | ORAL_TABLET | Freq: Every day | ORAL | Status: DC

## 2022-04-26 MED ORDER — ACETAMINOPHEN 650 MG RE SUPP: 650.0000 mg | RECTAL | Status: DC | PRN

## 2022-04-26 MED ORDER — ACETAMINOPHEN 160 MG/5ML PO SOLN: 650.0000 mg | ORAL | Status: DC | PRN

## 2022-04-26 MED ORDER — PANTOPRAZOLE SODIUM 40 MG PO TBEC
40.0000 mg | DELAYED_RELEASE_TABLET | Freq: Every day | ORAL | Status: DC
  Administered 2022-04-26: 40 mg via ORAL
  Filled 2022-04-26: qty 1

## 2022-04-26 MED ORDER — ATORVASTATIN CALCIUM 80 MG PO TABS: 80.0000 mg | ORAL_TABLET | Freq: Every day | ORAL | 2 refills | Status: DC

## 2022-04-26 MED ORDER — STROKE: EARLY STAGES OF RECOVERY BOOK: Freq: Once | Status: DC

## 2022-04-26 MED ORDER — METFORMIN HCL 500 MG PO TABS: 500.0000 mg | ORAL_TABLET | Freq: Two times a day (BID) | ORAL | 2 refills | Status: DC

## 2022-04-26 MED ORDER — CLOPIDOGREL BISULFATE 75 MG PO TABS
75.0000 mg | ORAL_TABLET | Freq: Every day | ORAL | Status: DC
  Administered 2022-04-26: 75 mg via ORAL
  Filled 2022-04-26: qty 1

## 2022-04-26 MED ORDER — ACETAMINOPHEN 325 MG PO TABS
650.0000 mg | ORAL_TABLET | ORAL | Status: DC | PRN
  Administered 2022-04-26: 650 mg via ORAL
  Filled 2022-04-26: qty 2

## 2022-04-26 MED ORDER — LIVING WELL WITH DIABETES BOOK
Freq: Once | Status: DC
  Filled 2022-04-26: qty 1

## 2022-04-26 MED ORDER — ASPIRIN 81 MG PO TABS
81.0000 mg | ORAL_TABLET | Freq: Every day | ORAL | 0 refills | Status: AC
Start: 2022-04-26 — End: 2022-05-17

## 2022-04-26 MED ORDER — SENNOSIDES-DOCUSATE SODIUM 8.6-50 MG PO TABS: 1.0000 | ORAL_TABLET | Freq: Every evening | ORAL | Status: DC | PRN

## 2022-04-26 MED ORDER — ASPIRIN 81 MG PO TBEC
81.0000 mg | DELAYED_RELEASE_TABLET | Freq: Every day | ORAL | Status: DC
  Administered 2022-04-26: 81 mg via ORAL
  Filled 2022-04-26: qty 1

## 2022-04-26 MED ORDER — INSULIN ASPART 100 UNIT/ML IJ SOLN: 0.0000 [IU] | Freq: Three times a day (TID) | INTRAMUSCULAR | Status: DC

## 2022-04-26 MED ORDER — CYCLOBENZAPRINE HCL 10 MG PO TABS: 10.0000 mg | ORAL_TABLET | Freq: Two times a day (BID) | ORAL | Status: DC | PRN

## 2022-04-26 MED ORDER — FENOFIBRATE 160 MG PO TABS
160.0000 mg | ORAL_TABLET | Freq: Every day | ORAL | Status: DC
  Filled 2022-04-26: qty 1

## 2022-04-26 MED ORDER — FENOFIBRATE 160 MG PO TABS: 160.0000 mg | ORAL_TABLET | Freq: Every day | ORAL | 2 refills | Status: DC

## 2022-04-26 MED ORDER — INSULIN GLARGINE-YFGN 100 UNIT/ML ~~LOC~~ SOLN
15.0000 [IU] | Freq: Every day | SUBCUTANEOUS | Status: DC
  Filled 2022-04-26: qty 0.15

## 2022-04-26 MED ORDER — INSULIN ASPART 100 UNIT/ML IJ SOLN
0.0000 [IU] | Freq: Every day | INTRAMUSCULAR | Status: DC
  Administered 2022-04-26: 3 [IU] via SUBCUTANEOUS

## 2022-04-26 MED ORDER — CLOPIDOGREL BISULFATE 75 MG PO TABS: 75.0000 mg | ORAL_TABLET | Freq: Every day | ORAL | 2 refills | Status: DC

## 2022-04-26 MED ORDER — LABETALOL HCL 5 MG/ML IV SOLN: 10.0000 mg | INTRAVENOUS | Status: DC | PRN

## 2022-04-26 NOTE — Progress Notes (Signed)
PT Cancellation Note  Patient Details Name: Steven Mcclain MRN: [id number] DOB: October 30, 1966   Cancelled Treatment:    Reason Eval/Treat Not Completed: Other (comment).  No PT is needed, pt is walking independently with no issues, PT will sign off.   Ivar Drape 04/26/2022, 11:59 AM  Samul Dada, PT PhD Acute Rehab Dept. Number: [facility] [id number] and [facility] [phone]

## 2022-04-26 NOTE — Plan of Care (Addendum)
Neurology Consult Plan of Care   Patient being followed by neurology on consult for: Transient Ischemic Attack TIA   In brief Steven Mcclain is a 56 y.o. male with PMH significant for hypertension, hyperlipidemia and prediabetes who presented to [facility] ED with a transient episode of left sided numbness and tingling.  LKW: 1130AM mRS: 0 tNKASE: Not offered due to resolution of symptoms. Thrombectomy: Not offered due to resolution of symptoms.  TIA work up in process including:  - Frequent Neuro checks per stroke unit protocol - Recommend Vascular imaging with MRA Angio Head without contrast and US Carotid doppler - Recommend obtaining TTE - EF 55-60% and no shunt - Recommend obtaining Lipid panel with LDL- 134.7 and Triglycerides 558 ( obtained separately from LDL)  - Started high dose statin as indicated by LDL >70. Lipitor 80mg  QHS and Tricor 160mg  QD - Recommend HbA1c- 7.1 and consulted Diabetes educator to speak with new diabetic patient. - Antithrombotic - Aspirin 81mg  daily along with plavix 75mg  daily x 21 days followed by Plavix 75mg  daily alone. - Recommend DVT ppx - SBP goal - permissive hypertension first 24 h < 220/110. Held home meds.  - Recommend Telemetry monitoring for arrythmia - Recommend bedside swallow screen prior to PO intake. - Stroke education booklet - Recommend PT/OT/SLP consult - EEG to rule out seizure, encephalopathy as cause of transient episode- This study is within normal limits. No seizures or epileptiform discharges were seen throughout the recording.     Neurology will Sign Off with above stroke work up completed and patient being discharged home.     , NP 04/26/22 4:47 PM   STROKE MD NOTE :  I have personally obtained history,examined this patient, reviewed notes, independently viewed imaging studies, participated in medical decision making and plan of care.ROS completed by me personally and pertinent positives fully documented  I  have made any additions or clarifications directly to the above note. Agree with note above.  Patient presented with transient fleeting left face and body paresthesias possible right brain subcortical TIA from small vessel disease though the timing of progression of symptoms is a little odd.  There is are no accompanying symptoms to suggest migraine or focal jerking to suggest seizure.  MRI brain is negative for acute stroke.  Neurological exam is normal.  Check carotid ultrasound and echocardiogram.  EEG for seizure activity.  Recommend aspirin and Plavix for 3 weeks followed by Plavix alone and aggressive risk factor modification.  Statin for elevated lipids and Tricor for elevated triglycerides.  Long discussion with patient about stroke and TIA risk and prevention and answering questions.  Discussed with Dr. .  Greater than 50% time during this 35-minute visit were spent in counseling and coordination of care about TIA and stroke prevention and answering questions and discussion with care team.  Follow-up as outpatient stroke clinic in 2 months.  Stroke team will sign off.  , MD Medical Director [facility] Pager: [phone] 04/26/2022 5:21 PM

## 2022-04-26 NOTE — Consult Note (Signed)
NEUROLOGY CONSULTATION NOTE   Date of service: April 26, 2022 Patient Name: Steven Mcclain MRN:  [id number] DOB:  [birth year] Reason for consult: "L sided numbness and tingling" Requesting Provider: Maia Plan, MD _ _ _   _ __   _ __ _ _  __ __   _ __   __ _  History of Present Illness  Steven Mcclain is a 56 y.o. male with PMH significant for hypertension, hyperlipidemia, prediabetes who presents with an episode of left-sided numbness and tingling.  Patient reports that he got up at 0430 on 04/25/2022 and was at his baseline.  He was feeling a little groggy and fatigued.  He works from home and took a break around 11 30-11 40 5 AM.  He was going downstairs when he developed sudden onset numbness around his lips and numbness involving left Genis extremity and left thigh.  He had no weakness or any balance issues and was able to go downstairs fine.  The numbness lasted for an hour and then went away.  Around 3 PM, he had recurrent similar numbness involving again the lips in the left arm and left leg that lasted about 20 minutes and then went away.  He came into the ED for further evaluation and work-up of this.  He denies any prior similar episode, reports family history of stroke in his maternal grandmother.  He occasionally smokes cigars.  He drinks of bourbon a night.  Endorses that he has high blood pressure but is not on any medication because it was controlled by his diet.  He also endorses prediabetes but is not on any medication.  Also endorses a history of hyperlipidemia but is not on any medication.  In the ED, he was noted to have hyperkalemia to 6.9 with no EKG changes.  He was given Lokelma and repeat potassium was normal.  LKW: 1130AM mRS: 0 tNKASE: Not offered due to resolution of symptoms. Thrombectomy: Not offered due to resolution of symptoms. NIHSS components Score: Comment  1a Level of Conscious 0[x]  1[]  2[]  3[]      1b LOC Questions 0[x]  1[]  2[]       1c LOC  Commands 0[x]  1[]  2[]       2 Best Gaze 0[x]  1[]  2[]       3 Visual 0[x]  1[]  2[]  3[]      4 Facial Palsy 0[x]  1[]  2[]  3[]      5a Motor Arm - left 0[x]  1[]  2[]  3[]  4[]  UN[]    5b Motor Arm - Right 0[x]  1[]  2[]  3[]  4[]  UN[]    6a Motor Leg - Left 0[x]  1[]  2[]  3[]  4[]  UN[]    6b Motor Leg - Right 0[x]  1[]  2[]  3[]  4[]  UN[]    7 Limb Ataxia 0[x]  1[]  2[]  3[]  UN[]     8 Sensory 0[x]  1[]  2[]  UN[]      9 Best Language 0[x]  1[]  2[]  3[]      10 Dysarthria 0[x]  1[]  2[]  UN[]      11 Extinct. and Inattention 0[x]  1[]  2[]       TOTAL: 0      ROS   Constitutional Denies weight loss, fever and chills.   HEENT Denies changes in vision and hearing.   Respiratory Denies SOB and cough.   CV Denies palpitations and CP   GI Denies abdominal pain, nausea, vomiting and diarrhea.   GU Denies dysuria and urinary frequency.   MSK Denies myalgia and joint pain.   Skin Denies rash and pruritus.   Neurological Denies  headache and syncope.   Psychiatric Denies recent changes in mood. Denies anxiety and depression.    Past History   Past Medical History:  Diagnosis Date   ALLERGIC RHINITIS 05/26/2008   ELEVATED BLOOD PRESSURE WITHOUT DIAGNOSIS OF HYPERTENSION 05/26/2008   ERECTILE DYSFUNCTION 05/26/2008   HYPERLIPIDEMIA 05/26/2008   Impaired glucose tolerance 08/01/2014   Past Surgical History:  Procedure Laterality Date   s/p facial cyst  1984   Family History  Problem Relation Age of Onset   Coronary artery disease Father    Diabetes Father    Heart disease Father    Hyperlipidemia Father    Cancer Other    Mental retardation Mother    Stroke Maternal Grandmother    Social History   Socioeconomic History   Marital status: Married    Spouse name: Not on file   Number of children: 3   Years of education: Not on file   Highest education level: Not on file  Occupational History   Occupation: Nurse, mental health for [employer]  Tobacco Use   Smoking status: Former    Types: Cigars    Smokeless tobacco: Never   Tobacco comments:    4 cigars per year  Substance and Sexual Activity   Alcohol use: Yes    Comment: social   Drug use: Not Currently   Sexual activity: Not on file  Other Topics Concern   Not on file  Social History Narrative   Not on file   Social Determinants of Health   Financial Resource Strain: Not on file  Food Insecurity: Not on file  Transportation Needs: Not on file  Physical Activity: Not on file  Stress: Not on file  Social Connections: Not on file   No Known Allergies  Medications  (Not in a hospital admission)    Vitals   Vitals:   04/25/22 2153 04/25/22 2200 04/25/22 2215 04/25/22 2230  BP: (!) 176/121 (!) 160/118  (!) 167/111  Pulse: 68 78 66 75  Resp: 16     Temp: 98.5 F (36.9 C)     TempSrc: Oral     SpO2: 100% 95% 95% 99%  Weight:      Height:         Body mass index is 29.53 kg/m.  Physical Exam   General: Laying comfortably in bed; in no acute distress.  HENT: Normal oropharynx and mucosa. Normal external appearance of ears and nose.  Neck: Supple, no pain or tenderness  CV: No JVD. No peripheral edema.  Pulmonary: Symmetric Chest rise. Normal respiratory effort.  Abdomen: Soft to touch, non-tender.  Ext: No cyanosis, edema, or deformity  Skin: No rash. Normal palpation of skin.   Musculoskeletal: Normal digits and nails by inspection. No clubbing.   Neurologic Examination  Mental status/Cognition: Alert, oriented to self, place, month and year, good attention.  Speech/language: Fluent, comprehension intact, object naming intact, repetition intact.  Cranial nerves:   CN II Pupils equal and reactive to light, no VF deficits    CN Mcclain,IV,VI EOM intact, no gaze preference or deviation, no nystagmus    CN V normal sensation in V1, V2, and V3 segments bilaterally    CN VII no asymmetry, no nasolabial fold flattening    CN VIII normal hearing to speech    CN IX & X normal palatal elevation, no uvular  deviation    CN XI 5/5 head turn and 5/5 shoulder shrug bilaterally    CN XII midline  tongue protrusion    Motor:  Muscle bulk: normal, tone normal, pronator drift none tremor none Mvmt Root Nerve  Muscle Right Left Comments  SA C5/6 Ax Deltoid 5 5   EF C5/6 Mc Biceps 5 5   EE C6/7/8 Rad Triceps 5 5   WF C6/7 Med FCR     WE C7/8 PIN ECU     F Ab C8/T1 U ADM/FDI 5 5   HF L1/2/3 Fem Illopsoas 5 5   KE L2/3/4 Fem Quad 5 5   DF L4/5 D Peron Tib Ant 5 5   PF S1/2 Tibial Grc/Sol 5 5    Reflexes:  Right Left Comments  Pectoralis      Biceps (C5/6) 2 2   Brachioradialis (C5/6) 2 2    Triceps (C6/7) 2 2    Patellar (L3/4) 2 2    Achilles (S1)      Hoffman      Plantar     Jaw jerk    Sensation:  Light touch Intact throughout   Pin prick    Temperature    Vibration   Proprioception    Coordination/Complex Motor:  - Finger to Nose intact bilaterally - Heel to shin intact bilaterally - Rapid alternating movement are normal - Gait: deferred.  Labs   CBC:  Recent Labs  Lab 04/25/22 1355 04/25/22 1404  WBC 14.2*  --   NEUTROABS 10.3*  --   HGB 16.8 15.6  HCT 46.4 46.0  MCV 90.6  --   PLT 281  --     Basic Metabolic Panel:  Lab Results  Component Value Date   NA 133 (L) 04/25/2022   K 3.9 04/25/2022   CO2 22 04/25/2022   GLUCOSE 272 (H) 04/25/2022   BUN 35 (H) 04/25/2022   CREATININE 1.10 04/25/2022   CALCIUM 8.2 (L) 04/25/2022   GFRNONAA >60 04/25/2022   GFRAA 95 05/26/2008   Lipid Panel: No results found for: "LDLCALC" HgbA1c:  Lab Results  Component Value Date   HGBA1C 5.6 07/30/2014   Urine Drug Screen:     Component Value Date/Time   LABOPIA NONE DETECTED 04/25/2022 1457   COCAINSCRNUR NONE DETECTED 04/25/2022 1457   LABBENZ NONE DETECTED 04/25/2022 1457   AMPHETMU NONE DETECTED 04/25/2022 1457   THCU NONE DETECTED 04/25/2022 1457   LABBARB NONE DETECTED 04/25/2022 1457    Alcohol Level     Component Value Date/Time   ETH <10 04/25/2022  1355    CT Head without contrast(Personally reviewed): CTH was negative for a large hypodensity concerning for a large territory infarct or hyperdensity concerning for an ICH  MR Angio head without contrast and Carotid Duplex BL(Personally reviewed): pending  MRI Brain(Personally reviewed): No acute stroke  Impression   Ashur Glatfelter Chrisley Mcclain is a 55 y.o. male with PMH significant for with PMH significant for hypertension, hyperlipidemia, prediabetes who presents with an episode of left-sided numbness and tingling.  Suspect this episode was more for TIA given his stroke risk factors including prediabetes, hypertension, hyperlipidemia which do not appear to be well controlled at this time.  Although hyperkalemia could be contributing, I would expect hyperkalemia to cause more of a symmetric weakness and sensory changes.   His neurologic examination is notable for no focal deficit.  Primary Diagnosis:  Cerebral infarction, unspecified.  Secondary Diagnosis: Essential (primary) hypertension and Type 2 diabetes mellitus w/o complications  Recommendations   - Frequent Neuro checks per stroke unit protocol - Recommend Vascular imaging with MRA  Angio Head without contrast and US Carotid doppler - Recommend obtaining TTE  - Recommend obtaining Lipid panel with LDL - Please start statin if LDL > 70 - Recommend HbA1c - Antithrombotic - Aspirin 81mg  daily along with plavix 75mg  daily x 21 days followed by Plavix 75mg  daily alone. - Recommend DVT ppx - SBP goal - permissive hypertension first 24 h < 220/110. Held home meds.  - Recommend Telemetry monitoring for arrythmia - Recommend bedside swallow screen prior to PO intake. - Stroke education booklet - Recommend PT/OT/SLP consult  ______________________________________________________________________   Thank you for the opportunity to take part in the care of this patient. If you have any further questions, please contact the neurology  consultation attending.  Signed,  [facility] Pager Number _ _ _   _ __   _ __ _ _  __ __   _ __   __ _

## 2022-04-26 NOTE — Procedures (Signed)
Patient Name: Steven Mcclain  MRN: [id number]  Epilepsy Attending: Charlsie Quest  Referring Physician/Provider: Sarita Bottom, NP  Date: 04/26/2022  Duration: 22.32 mins  Patient history: 56 y.o. male with PMH significant for with PMH significant for hypertension, hyperlipidemia, prediabetes who presents with an episode of left-sided numbness and tingling. EEG to evaluate for seizure.  Level of alertness: Awake, asleep  AEDs during EEG study: None  Technical aspects: This EEG study was done with scalp electrodes positioned according to the 10-20 International system of electrode placement. Electrical activity was acquired at a sampling rate of 500Hz  and reviewed with a high frequency filter of 70Hz  and a low frequency filter of 1Hz . EEG data were recorded continuously and digitally stored.   Description: The posterior dominant rhythm consists of 10 Hz activity of moderate voltage (25-35 uV) seen predominantly in posterior head regions, symmetric and reactive to eye opening and eye closing. Sleep was characterized by vertex waves, sleep spindles (12 to 14 Hz), maximal frontocentral region. Hyperventilation and photic stimulation were not performed.     IMPRESSION: This study is within normal limits. No seizures or epileptiform discharges were seen throughout the recording.  [name] 

## 2022-04-26 NOTE — H&P (Signed)
History and Physical    Steven Mcclain BSW:[id number] DOB: 01/24/1966 DOA: 04/25/2022  PCP: Carlean Jews, NP   Patient coming from: Home   Chief Complaint: Numbness and tingling   HPI: Steven Mcclain is a pleasant 56 y.o. male who describes himself is generally healthy but notes that his blood pressure and glucose have been borderline elevated for several years, and now presents emergency department for evaluation of numbness and tingling.  The patient reports that he went to bed last night in his usual state, felt a little more drowsy than usual when he woke this morning at approximately 4:30 AM but did not think much of it, and then at approximately 11:30 AM or noon, he developed tingling all across both lips.  Shortly after this, he developed numbness, tingling, and a heavy sensation in his [place] left arm, and then the lateral left leg.  Symptoms lasted approximately an hour, though the lip tingling persisted a little bit longer than this.  He then had a brief recurrence of his symptoms which lasted 15 to 30 minutes before he returned to his usual state.  He denies any chest pain, palpitations, or change in vision.  No recent fever or chills.  He has almost completed a prednisone taper for back pain, and was also taking muscle relaxants but did not take any yesterday as his back is feeling better.   Patient reports a history of borderline low potassium for which she has been using supplements including blackstrap molasses to try to raise it.  ED Course: Upon arrival to the ED, patient is found to be afebrile, saturating 90s to 100 on room air, and hypertensive.  EKG features sinus rhythm, chest x-ray negative for acute cardiopulmonary disease, and head CT was normal study.  MRI brain is negative for acute infarction, hemorrhage, or mass.  Chemistry panel was notable for potassium of 6.1 and glucose 260.  CBC notable for leukocytosis to 14,200.  Troponin was normal x2.  Patient was  treated with IV calcium and Lokelma in the ED.  Neurology was consulted by the ED physician and recommended admission for TIA work-up.  Review of Systems:  All other systems reviewed and apart from HPI, are negative.  Past Medical History:  Diagnosis Date   ALLERGIC RHINITIS 05/26/2008   ELEVATED BLOOD PRESSURE WITHOUT DIAGNOSIS OF HYPERTENSION 05/26/2008   ERECTILE DYSFUNCTION 05/26/2008   HYPERLIPIDEMIA 05/26/2008   Impaired glucose tolerance 08/01/2014    Past Surgical History:  Procedure Laterality Date   s/p facial cyst  1984    Social History:   reports that he has quit smoking. His smoking use included cigars. He has never used smokeless tobacco. He reports current alcohol use. He reports that he does not currently use drugs.  No Known Allergies  Family History  Problem Relation Age of Onset   Coronary artery disease Father    Diabetes Father    Heart disease Father    Hyperlipidemia Father    Cancer Other    Mental retardation Mother    Stroke Maternal Grandmother      Prior to Admission medications   Medication Sig Start Date End Date Taking? Authorizing Provider  acetaminophen (TYLENOL) 500 MG tablet Take 1,000 mg by mouth 3 (three) times daily as needed (back pain).   Yes [provider]  albuterol (VENTOLIN HFA) 108 (90 Base) MCG/ACT inhaler Inhale 2 puffs into the lungs every 4 (four) hours as needed for wheezing or shortness of breath.  Patient taking differently: Inhale 2 puffs into the lungs as needed for wheezing or shortness of breath. 06/28/21  Yes Valinda Hoar, NP  aspirin 81 MG tablet Take 81 mg by mouth daily.   Yes [provider]  B Complex Vitamins (B COMPLEX PO) Take 1 tablet by mouth daily.   Yes [provider]  Calcium Carbonate Antacid (TUMS PO) Take 1 tablet by mouth as needed (acid reflux).   Yes [provider]  cyclobenzaprine (FLEXERIL) 10 MG tablet Take 1 tablet (10 mg total) by mouth 2 (two) times daily  as needed for muscle spasms. Patient taking differently: Take 10 mg by mouth at bedtime. 04/20/22  Yes Boscia, Kathlynn Grate, NP  diclofenac (VOLTAREN) 50 MG EC tablet Take 1 tablet (50 mg total) by mouth 2 (two) times daily. Patient taking differently: Take 50 mg by mouth at bedtime. 04/20/22  Yes Boscia, Kathlynn Grate, NP  ibuprofen (ADVIL) 200 MG tablet Take 200 mg by mouth as needed (pain).   Yes [provider]  Multiple Vitamin (MULTI VITAMIN DAILY) TABS Take 1 tablet by mouth daily.   Yes [provider]  naproxen sodium (ALEVE) 220 MG tablet Take 220 mg by mouth once.   Yes [provider]  Omeprazole 20 MG TBDD Take 1 capsule by mouth daily. Patient taking differently: Take 20 mg by mouth daily. 04/20/22  Yes Boscia, Kathlynn Grate, NP  OVER THE COUNTER MEDICATION Apply 1 Application topically 3 (three) times daily as needed (itching). Benadryl cream ex   Yes [provider]  predniSONE (STERAPRED UNI-PAK 21 TAB) 10 MG (21) TBPK tablet 6 day taper - take by mouth as directed for 6 days 04/20/22  Yes Boscia, Heather E, NP  tadalafil (CIALIS) 20 MG tablet Take 1 tablet po prn Patient taking differently: Take 10 mg by mouth See admin instructions. Take 10 mg by mouth every three days 04/20/22  Yes Boscia, Heather E, NP  VITAMIN D PO Take 1 tablet by mouth daily.   Yes [provider]    Physical Exam: Vitals:   04/25/22 2300 04/25/22 2330 04/26/22 0000 04/26/22 0030  BP: (!) 171/107 (!) 176/110 (!) 182/111 (!) 172/116  Pulse: 77 77 74 80  Resp:      Temp:      TempSrc:      SpO2: 97% 96% 96% 93%  Weight:      Height:        Constitutional: NAD, calm  Eyes: PERTLA, lids and conjunctivae normal ENMT: Mucous membranes are moist. Posterior pharynx clear of any exudate or lesions.   Neck: supple, no masses  Respiratory:  no wheezing, no crackles. No accessory muscle use.  Cardiovascular: S1 & S2 heard, regular rate and rhythm. No extremity edema.    Abdomen: No distension, no tenderness, soft. Bowel sounds active.  Musculoskeletal: no clubbing / cyanosis. No joint deformity [place] and lower extremities.   Skin: no significant rashes, lesions, ulcers. Warm, dry, well-perfused. Neurologic: CN 2-12 grossly intact. Sensation intact. Strength 5/5 in all 4 limbs. Alert and oriented.  Psychiatric: Pleasant. Cooperative.    Labs and Imaging on Admission: I have personally reviewed following labs and imaging studies  CBC: Recent Labs  Lab 04/25/22 1355 04/25/22 1404  WBC 14.2*  --   NEUTROABS 10.3*  --   HGB 16.8 15.6  HCT 46.4 46.0  MCV 90.6  --   PLT 281  --    Basic Metabolic Panel: Recent Labs  Lab 04/25/22  1355 04/25/22 1404 04/25/22 1658 04/25/22 2212  NA 134* 133*  --   --   K 6.1* 6.9*  --  3.9  CL 103 102  --   --   CO2 22  --   --   --   GLUCOSE 260* 272*  --   --   BUN 22* 35*  --   --   CREATININE 1.08 1.10  --   --   CALCIUM 8.2*  --   --   --   MG  --   --  2.2  --    GFR: Estimated Creatinine Clearance: 97.7 mL/min (by C-G formula based on SCr of 1.1 mg/dL). Liver Function Tests: Recent Labs  Lab 04/25/22 1355  AST 58*  ALT 41  ALKPHOS 42  BILITOT 1.3*  PROT 5.6*  ALBUMIN 3.5   No results for input(s): "LIPASE", "AMYLASE" in the last 168 hours. No results for input(s): "AMMONIA" in the last 168 hours. Coagulation Profile: Recent Labs  Lab 04/25/22 1709  INR 1.0   Cardiac Enzymes: No results for input(s): "CKTOTAL", "CKMB", "CKMBINDEX", "TROPONINI" in the last 168 hours. BNP (last 3 results) No results for input(s): "PROBNP" in the last 8760 hours. HbA1C: No results for input(s): "HGBA1C" in the last 72 hours. CBG: Recent Labs  Lab 04/25/22 1341  GLUCAP 226*   Lipid Profile: No results for input(s): "CHOL", "HDL", "LDLCALC", "TRIG", "CHOLHDL", "LDLDIRECT" in the last 72 hours. Thyroid Function Tests: No results for input(s): "TSH", "T4TOTAL", "FREET4", "T3FREE", "THYROIDAB" in the  last 72 hours. Anemia Panel: No results for input(s): "VITAMINB12", "FOLATE", "FERRITIN", "TIBC", "IRON", "RETICCTPCT" in the last 72 hours. Urine analysis:    Component Value Date/Time   COLORURINE YELLOW 04/25/2022 1457   APPEARANCEUR HAZY (A) 04/25/2022 1457   LABSPEC 1.011 04/25/2022 1457   PHURINE 6.0 04/25/2022 1457   GLUCOSEU >=500 (A) 04/25/2022 1457   GLUCOSEU NEGATIVE 07/29/2014 1029   HGBUR NEGATIVE 04/25/2022 1457   BILIRUBINUR NEGATIVE 04/25/2022 1457   KETONESUR NEGATIVE 04/25/2022 1457   PROTEINUR NEGATIVE 04/25/2022 1457   UROBILINOGEN 0.2 07/29/2014 1029   NITRITE NEGATIVE 04/25/2022 1457   LEUKOCYTESUR NEGATIVE 04/25/2022 1457   Sepsis Labs: @LABRCNTIP (procalcitonin:4,lacticidven:4) ) Recent Results (from the past 240 hour(s))  Resp Panel by RT-PCR (Flu A&B, Covid) Anterior Nasal Swab     Status: None   Collection Time: 04/25/22  2:57 PM   Specimen: Anterior Nasal Swab  Result Value Ref Range Status   SARS Coronavirus 2 by RT PCR NEGATIVE NEGATIVE Final    Comment: (NOTE) SARS-CoV-2 target nucleic acids are NOT DETECTED.  The SARS-CoV-2 RNA is generally detectable in [place] respiratory specimens during the acute phase of infection. The lowest concentration of SARS-CoV-2 viral copies this assay can detect is 138 copies/mL. A negative result does not preclude SARS-Cov-2 infection and should not be used as the sole basis for treatment or other patient management decisions. A negative result may occur with  improper specimen collection/handling, submission of specimen other than nasopharyngeal swab, presence of viral mutation(s) within the areas targeted by this assay, and inadequate number of viral copies(<138 copies/mL). A negative result must be combined with clinical observations, patient history, and epidemiological information. The expected result is Negative.  Fact Sheet for Patients:  04/27/22  Fact Sheet for  Healthcare Providers:  BloggerCourse.com  This test is no t yet approved or cleared by the [employer] FDA and  has been authorized for detection and/or diagnosis of SARS-CoV-2 by FDA under  an Emergency Use Authorization (EUA). This EUA will remain  in effect (meaning this test can be used) for the duration of the COVID-19 declaration under Section 564(b)(1) of the Act, 21 U.S.C.section 360bbb-3(b)(1), unless the authorization is terminated  or revoked sooner.       Influenza A by PCR NEGATIVE NEGATIVE Final   Influenza B by PCR NEGATIVE NEGATIVE Final    Comment: (NOTE) The Xpert Xpress SARS-CoV-2/FLU/RSV plus assay is intended as an aid in the diagnosis of influenza from Nasopharyngeal swab specimens and should not be used as a sole basis for treatment. Nasal washings and aspirates are unacceptable for Xpert Xpress SARS-CoV-2/FLU/RSV testing.  Fact Sheet for Patients: BloggerCourse.com  Fact Sheet for Healthcare Providers: SeriousBroker.it  This test is not yet approved or cleared by the [place] FDA and has been authorized for detection and/or diagnosis of SARS-CoV-2 by FDA under an Emergency Use Authorization (EUA). This EUA will remain in effect (meaning this test can be used) for the duration of the COVID-19 declaration under Section 564(b)(1) of the Act, 21 U.S.C. section 360bbb-3(b)(1), unless the authorization is terminated or revoked.  Performed at [facility], [address] [place], [place] [address]      Radiological Exams on Admission: MR BRAIN WO CONTRAST  Result Date: 04/25/2022 CLINICAL DATA:  Neuro deficit, acute, stroke suspected EXAM: MRI HEAD WITHOUT CONTRAST TECHNIQUE: Multiplanar, multiecho pulse sequences of the brain and surrounding structures were obtained without intravenous contrast. COMPARISON:  None Available. FINDINGS: Brain: There is no acute infarction  or intracranial hemorrhage. There is no intracranial mass, mass effect, or edema. There is no hydrocephalus or extra-axial fluid collection. Ventricles and sulci are within normal limits in size and configuration. Minimal patchy foci of T2 hyperintensity in the supratentorial white matter likely reflect nonspecific gliosis/demyelination. Vascular: Major vessel flow voids at the skull base are preserved. Skull and Jaroszewski cervical spine: Normal marrow signal is preserved. Sinuses/Orbits: Paranasal sinus mucosal thickening. Orbits are unremarkable. Other: Sella is unremarkable.  Mastoid air cells are clear. IMPRESSION: No acute infarction, hemorrhage, or mass. Minimal foci of nonspecific gliosis/demyelination in the cerebral white matter possibly reflecting chronic microvascular ischemic changes. Electronically Signed   By: Guadlupe Spanish M.D.   On: 04/25/2022 18:51   DG Chest 2 View  Result Date: 04/25/2022 CLINICAL DATA:  Tingling left side of body EXAM: CHEST - 2 VIEW COMPARISON:  09/18/2003 FINDINGS: The heart size and mediastinal contours are within normal limits. Both lungs are clear. The visualized skeletal structures are unremarkable. IMPRESSION: No active cardiopulmonary disease. Electronically Signed   By: Ernie Avena M.D.   On: 04/25/2022 15:00   CT Head Wo Contrast  Result Date: 04/25/2022 CLINICAL DATA:  Sudden onset of left neck, arm and leg tingling. EXAM: CT HEAD WITHOUT CONTRAST TECHNIQUE: Contiguous axial images were obtained from the base of the skull through the vertex without intravenous contrast. RADIATION DOSE REDUCTION: This exam was performed according to the departmental dose-optimization program which includes automated exposure control, adjustment of the mA and/or kV according to patient size and/or use of iterative reconstruction technique. COMPARISON:  None Available. FINDINGS: Brain: No evidence of acute infarction, hemorrhage, hydrocephalus, extra-axial collection or mass  lesion/mass effect. Vascular: No hyperdense vessel or unexpected calcification. Skull: Normal. Negative for fracture or focal lesion. Sinuses/Orbits: Globes and orbits are unremarkable. Visualized sinuses are clear. Other: None. IMPRESSION: Normal unenhanced CT scan of the brain. Electronically Signed   By: Amie Portland M.D.   On: 04/25/2022  14:42    EKG: Independently reviewed. Sinus rhythm.   Assessment/Plan   1. TIA  - Presents after an episode of left-sided numbness and tingling  - No acute findings on MRI brain  - Passed swallow screen in ED  - Appreciate neurology consultation, recommending MRA head, CUS, TTE, lipid panel and A1c, ASA 81 and Plavix 75 for 21 days then Plavix only, permissive HTN, PT/OT/SLP eval    2. Hyperkalemia   - Serum potassium was 6.1 in ED, down to 3.9 after Lokelma and calcium  - Patient reports hx of borderline low potassium and is taking supplements to raise it; danger of hyperkalemia was discussed with patient and his wife  - Continue cardiac monitoring, repeat chem panel    3. Elevated BP  - Severe asymptomatic hypertension in ED  - Neuro recommending permissive HTN up to 220/110 for now    4. Hyperglycemia - Serum glucose is 260 in ED in setting of prednisone taper for back pain  - Check A1c, check CBGs, use low-intensity SSI if needed    5. Leukocytosis  - WBC is 14,200 in ED without other infectious s/s - Monitor    DVT prophylaxis: Lovenox  Code Status: Full  Level of Care: Level of care: Telemetry Medical Family Communication: Wife at bedside  Disposition Plan:  Patient is from: home  Anticipated d/c is to: Home  Anticipated d/c date is: 04/27/22  Patient currently: pending TIA workup  Consults called: neurology  Admission status: Observation     Briscoe Deutscherimothy S Ralphie Lovelady, MD [facility]  04/26/2022, 1:19 AM

## 2022-04-26 NOTE — Progress Notes (Signed)
EEG complete - results pending 

## 2022-04-26 NOTE — Discharge Summary (Signed)
Physician Discharge Summary  Steven Mcclain ZHY:[id number] DOB: 11-30-1965 DOA: 04/25/2022  PCP: Carlean Jews, NP  Admit date: 04/25/2022 Discharge date: 04/26/2022  Admitted From: Home Disposition:  Home  Recommendations for Outpatient Follow-up:  Follow up with PCP in 1 week.  Continue follow up for hyperglycemia and hypertension, hypercholesterolemia Follow up with neurology Recommend repeat LFT to check transaminitis  Discharge Condition: Stable CODE STATUS: Full code Diet recommendation: Heart healthy diet  Brief/Interim Summary: From H&P by Dr. Antionette Char: Steven Mcclain is a pleasant 56 y.o. male who describes himself is generally healthy but notes that his blood pressure and glucose have been borderline elevated for several years, and now presents emergency department for evaluation of numbness and tingling.  The patient reports that he went to bed last night in his usual state, felt a little more drowsy than usual when he woke this morning at approximately 4:30 AM but did not think much of it, and then at approximately 11:30 AM or noon, he developed tingling all across both lips.  Shortly after this, he developed numbness, tingling, and a heavy sensation in his [place] left arm, and then the lateral left leg.  Symptoms lasted approximately an hour, though the lip tingling persisted a little bit longer than this.  He then had a brief recurrence of his symptoms which lasted 15 to 30 minutes before he returned to his usual state.  He denies any chest pain, palpitations, or change in vision.  No recent fever or chills.  He has almost completed a prednisone taper for back pain, and was also taking muscle relaxants but did not take any yesterday as his back is feeling better.    Patient reports a history of borderline low potassium for which she has been using supplements including blackstrap molasses to try to raise it.   ED Course: Upon arrival to the ED, patient is found to be  afebrile, saturating 90s to 100 on room air, and hypertensive.  EKG features sinus rhythm, chest x-ray negative for acute cardiopulmonary disease, and head CT was normal study.  MRI brain is negative for acute infarction, hemorrhage, or mass.  Chemistry panel was notable for potassium of 6.1 and glucose 260.  CBC notable for leukocytosis to 14,200.  Troponin was normal x2.  Patient was treated with IV calcium and Lokelma in the ED.  Neurology was consulted by the ED physician and recommended admission for TIA work-up."   Subjective on day of discharge: Patient symptoms resolved.  MRI brain was negative for acute ischemia.  MRA head negative for intracranial large vessel occlusion or significant stenosis.  Echocardiogram revealed EF 55-60%, grade 1 diastolic dysfunction.  EEG was negative for seizures or epileptiform discharges. Carotid ultrasounds bilaterally showed 1 to 39% stenosis. Patient had elevated blood sugars, likely correlating to his recent prednisone use.  He was started on dual antiplatelet therapy with aspirin and Plavix for 3 weeks, then to continue Plavix alone.  He was also started on metformin due to diagnosis of diabetes.  Discharge Diagnoses:   Principal Problem:   TIA (transient ischemic attack) Active Problems:   Elevated blood pressure reading without diagnosis of hypertension   Hyperglycemia   HLD (hyperlipidemia)   Hyperkalemia   Transaminitis   Discharge Instructions  Discharge Instructions     Ambulatory referral to Neurology   Complete by: As directed    An appointment is requested in approximately: 4 weeks   Call MD for:  difficulty breathing, headache or  visual disturbances   Complete by: As directed    Call MD for:  extreme fatigue   Complete by: As directed    Call MD for:  persistant dizziness or light-headedness   Complete by: As directed    Call MD for:  persistant nausea and vomiting   Complete by: As directed    Call MD for:  severe uncontrolled  pain   Complete by: As directed    Call MD for:  temperature >100.4   Complete by: As directed    Diet - low sodium heart healthy   Complete by: As directed    Discharge instructions   Complete by: As directed    Continue aspirin and plavix for 3 weeks. Then stop aspirin and continue plavix alone.   You were cared for by a hospitalist during your hospital stay. If you have any questions about your discharge medications or the care you received while you were in the hospital after you are discharged, you can call the unit and ask to speak with the hospitalist on call if the hospitalist that took care of you is not available. Once you are discharged, your primary care physician will handle any further medical issues. Please note that NO REFILLS for any discharge medications will be authorized once you are discharged, as it is imperative that you return to your primary care physician (or establish a relationship with a primary care physician if you do not have one) for your aftercare needs so that they can reassess your need for medications and monitor your lab values.   Increase activity slowly   Complete by: As directed       Allergies as of 04/26/2022   No Known Allergies      Medication List     STOP taking these medications    ibuprofen 200 MG tablet Commonly known as: ADVIL   naproxen sodium 220 MG tablet Commonly known as: ALEVE       TAKE these medications    acetaminophen 500 MG tablet Commonly known as: TYLENOL Take 1,000 mg by mouth 3 (three) times daily as needed (back pain).   albuterol 108 (90 Base) MCG/ACT inhaler Commonly known as: VENTOLIN HFA Inhale 2 puffs into the lungs every 4 (four) hours as needed for wheezing or shortness of breath. What changed: when to take this   aspirin 81 MG tablet Take 1 tablet (81 mg total) by mouth daily for 21 days.   atorvastatin 80 MG tablet Commonly known as: LIPITOR Take 1 tablet (80 mg total) by mouth daily.   B  COMPLEX PO Take 1 tablet by mouth daily.   clopidogrel 75 MG tablet Commonly known as: PLAVIX Take 1 tablet (75 mg total) by mouth daily. Start taking on: April 27, 2022   cyclobenzaprine 10 MG tablet Commonly known as: FLEXERIL Take 1 tablet (10 mg total) by mouth 2 (two) times daily as needed for muscle spasms. What changed: when to take this   diclofenac 50 MG EC tablet Commonly known as: VOLTAREN Take 1 tablet (50 mg total) by mouth 2 (two) times daily. What changed: when to take this   fenofibrate 160 MG tablet Take 1 tablet (160 mg total) by mouth daily.   metFORMIN 500 MG tablet Commonly known as: Glucophage Take 1 tablet (500 mg total) by mouth 2 (two) times daily with a meal.   Multi Vitamin Daily Tabs Take 1 tablet by mouth daily.   Omeprazole 20 MG Tbdd Take 1 capsule by  mouth daily. What changed: how much to take   OVER THE COUNTER MEDICATION Apply 1 Application topically 3 (three) times daily as needed (itching). Benadryl cream ex   predniSONE 10 MG (21) Tbpk tablet Commonly known as: STERAPRED UNI-PAK 21 TAB 6 day taper - take by mouth as directed for 6 days   tadalafil 20 MG tablet Commonly known as: CIALIS Take 1 tablet po prn What changed:  how much to take how to take this when to take this additional instructions   TUMS PO Take 1 tablet by mouth as needed (acid reflux).   VITAMIN D PO Take 1 tablet by mouth daily.        Follow-up Information     Carlean Jews, NP. Schedule an appointment as soon as possible for a visit in 1 week(s).   Specialty: Family Medicine Contact information: [address] [place] [address] [phone]         [facility]. Schedule an appointment as soon as possible for a visit in 4 week(s).   Contact information: [address]     [address] [place] [address] [phone]               No Known Allergies  Consultations: Neurology     Procedures/Studies: VAS US CAROTID (at [facility] and [facility] only)  Result Date: 04/26/2022 Carotid Arterial Duplex Study Patient Name:  DAIVD FREDERICKSEN Mcclain  Date of Exam:   04/26/2022 Medical Rec #: [id number]           Accession #:    [id number] Date of Birth: [birth year]           Patient Gender: M Patient Age:   46 years Exam Location:  [facility] Procedure:      VAS US CAROTID Referring Phys: Marcial Pacas OPYD --------------------------------------------------------------------------------  Indications:       TIA and Numbness. Risk Factors:      Hyperlipidemia, past history of smoking. Comparison Study:  No previous exam noted. Performing Technologist: Magdalene River  Examination Guidelines: A complete evaluation includes B-mode imaging, spectral Doppler, color Doppler, and power Doppler as needed of all accessible portions of each vessel. Bilateral testing is considered an integral part of a complete examination. Limited examinations for reoccurring indications may be performed as noted.  Right Carotid Findings: +----------+--------+--------+--------+------------------+--------+           PSV cm/sEDV cm/sStenosisPlaque DescriptionComments +----------+--------+--------+--------+------------------+--------+ CCA Prox  102     17              heterogenous               +----------+--------+--------+--------+------------------+--------+ CCA Distal89      21                                         +----------+--------+--------+--------+------------------+--------+ ICA Prox  45      16              heterogenous               +----------+--------+--------+--------+------------------+--------+ ICA Distal53      23                                         +----------+--------+--------+--------+------------------+--------+ ECA  100     17                                         +----------+--------+--------+--------+------------------+--------+  +----------+--------+-------+----------------+-------------------+           PSV cm/sEDV cmsDescribe        Arm Pressure (mmHG) +----------+--------+-------+----------------+-------------------+ [id number]            Multiphasic, WNL                    +----------+--------+-------+----------------+-------------------+ +---------+--------+--+--------+--+---------+ VertebralPSV cm/s33EDV cm/s11Antegrade +---------+--------+--+--------+--+---------+  Left Carotid Findings: +----------+--------+--------+--------+------------------+--------+           PSV cm/sEDV cm/sStenosisPlaque DescriptionComments +----------+--------+--------+--------+------------------+--------+ CCA Prox  121     29                                         +----------+--------+--------+--------+------------------+--------+ CCA Distal86      24                                         +----------+--------+--------+--------+------------------+--------+ ICA Prox  39      16              heterogenous               +----------+--------+--------+--------+------------------+--------+ ICA Distal54      26                                         +----------+--------+--------+--------+------------------+--------+ ECA       112     23                                         +----------+--------+--------+--------+------------------+--------+ +----------+--------+--------+----------------+-------------------+           PSV cm/sEDV cm/sDescribe        Arm Pressure (mmHG) +----------+--------+--------+----------------+-------------------+ Subclavian118             Multiphasic, WNL                    +----------+--------+--------+----------------+-------------------+ +---------+--------+--------+---------+ VertebralPSV cm/sEDV cm/sAntegrade +---------+--------+--------+---------+   Summary: Right Carotid: Velocities in the right ICA are consistent with a 1-39% stenosis. Left Carotid:  Velocities in the left ICA are consistent with a 1-39% stenosis. Vertebrals:  Bilateral vertebral arteries demonstrate antegrade flow. Subclavians: Normal flow hemodynamics were seen in bilateral subclavian              arteries. *See table(s) above for measurements and observations.  Electronically signed by Delia Heady MD on 04/26/2022 at 1:12:28 PM.    Final    EEG adult  Result Date: 04/26/2022 Charlsie Quest, MD     04/26/2022 12:58 PM Patient Name: JAYSIN GAYLER Mcclain MRN: [id number] Epilepsy Attending: Charlsie Quest Referring Physician/Provider: Sarita Bottom, NP Date: 04/26/2022 Duration: 22.32 mins Patient history: 56 y.o. male with PMH significant for with PMH significant for hypertension, hyperlipidemia, prediabetes who presents with an episode of left-sided numbness and tingling. EEG to evaluate for seizure. Level of alertness:  Awake, asleep AEDs during EEG study: None Technical aspects: This EEG study was done with scalp electrodes positioned according to the 10-20 International system of electrode placement. Electrical activity was acquired at a sampling rate of 500Hz  and reviewed with a high frequency filter of 70Hz  and a low frequency filter of 1Hz . EEG data were recorded continuously and digitally stored. Description: The posterior dominant rhythm consists of 10 Hz activity of moderate voltage (25-35 uV) seen predominantly in posterior head regions, symmetric and reactive to eye opening and eye closing. Sleep was characterized by vertex waves, sleep spindles (12 to 14 Hz), maximal frontocentral region. Hyperventilation and photic stimulation were not performed.   IMPRESSION: This study is within normal limits. No seizures or epileptiform discharges were seen throughout the recording.   ECHOCARDIOGRAM COMPLETE  Result Date: 04/26/2022    ECHOCARDIOGRAM REPORT   Patient Name:   KAICEN DESENA Mcclain Date of Exam: 04/26/2022 Medical Rec #:  04/28/2022          Height:        74.0 in Accession #:    [id number]         Weight:       230.0 lb Date of Birth:  [birth year]          BSA:          2.306 m Patient Age:    56 years           BP:           158/104 mmHg Patient Gender: M                  HR:           75 bpm. Exam Location:  Inpatient Procedure: 2D Echo, Cardiac Doppler and Color Doppler Indications:    TIA  History:        Patient has no prior history of Echocardiogram examinations.  Sonographer:    [id number] Referring Phys: [id number] TIMOTHY S OPYD IMPRESSIONS  1. Left ventricular ejection fraction, by estimation, is 55 to 60%. The left ventricle has normal function. The left ventricle has no regional wall motion abnormalities. There is moderate concentric left ventricular hypertrophy. Left ventricular diastolic parameters are consistent with Grade I diastolic dysfunction (impaired relaxation).  2. Right ventricular systolic function is normal. The right ventricular size is normal. Tricuspid regurgitation signal is inadequate for assessing PA pressure.  3. The pericardial effusion is surrounding the apex.  4. The mitral valve is grossly normal. No evidence of mitral valve regurgitation. No evidence of mitral stenosis.  5. The aortic valve is tricuspid. Aortic valve regurgitation is not visualized. No aortic stenosis is present.  6. The inferior vena cava is dilated in size with <50% respiratory variability, suggesting right atrial pressure of 15 mmHg. Comparison(s): No prior Echocardiogram. FINDINGS  Left Ventricle: Left ventricular ejection fraction, by estimation, is 55 to 60%. The left ventricle has normal function. The left ventricle has no regional wall motion abnormalities. The left ventricular internal cavity size was normal in size. There is  moderate concentric left ventricular hypertrophy. Left ventricular diastolic parameters are consistent with Grade I diastolic dysfunction (impaired relaxation). Right Ventricle: The right ventricular size is normal. No increase in  right ventricular wall thickness. Right ventricular systolic function is normal. Tricuspid regurgitation signal is inadequate for assessing PA pressure. Left Atrium: Left atrial size was normal in size. Right Atrium: Right atrial size was normal in size. Pericardium: Trivial pericardial effusion  is present. The pericardial effusion is surrounding the apex. Mitral Valve: The mitral valve is grossly normal. No evidence of mitral valve regurgitation. No evidence of mitral valve stenosis. Tricuspid Valve: The tricuspid valve is normal in structure. Tricuspid valve regurgitation is not demonstrated. No evidence of tricuspid stenosis. Aortic Valve: The aortic valve is tricuspid. Aortic valve regurgitation is not visualized. No aortic stenosis is present. Aortic valve peak gradient measures 7.1 mmHg. Pulmonic Valve: The pulmonic valve was not well visualized. Pulmonic valve regurgitation is not visualized. No evidence of pulmonic stenosis. Aorta: The aortic root and ascending aorta are structurally normal, with no evidence of dilitation. Pulmonary Artery: The pulmonary artery is of normal size. Venous: The inferior vena cava is dilated in size with less than 50% respiratory variability, suggesting right atrial pressure of 15 mmHg. IAS/Shunts: No atrial level shunt detected by color flow Doppler.  LEFT VENTRICLE PLAX 2D LVIDd:         4.40 cm      Diastology LVIDs:         3.10 cm      LV e' medial:    7.40 cm/s LV PW:         1.30 cm      LV E/e' medial:  9.1 LV IVS:        1.40 cm      LV e' lateral:   7.62 cm/s LVOT diam:     2.00 cm      LV E/e' lateral: 8.9 LV SV:         62 LV SV Index:   27 LVOT Area:     3.14 cm  LV Volumes (MOD) LV vol d, MOD A2C: 134.0 ml LV vol d, MOD A4C: 127.0 ml LV vol s, MOD A2C: 51.4 ml LV vol s, MOD A4C: 53.0 ml LV SV MOD A2C:     82.6 ml LV SV MOD A4C:     127.0 ml LV SV MOD BP:      76.6 ml RIGHT VENTRICLE             IVC RV Basal diam:  3.20 cm     IVC diam: 2.20 cm RV Mid diam:    2.90  cm RV S prime:     14.60 cm/s TAPSE (M-mode): 2.6 cm LEFT ATRIUM             Index        RIGHT ATRIUM           Index LA diam:        4.20 cm 1.82 cm/m   RA Area:     13.00 cm LA Vol (A2C):   59.8 ml 25.93 ml/m  RA Volume:   30.60 ml  13.27 ml/m LA Vol (A4C):   44.3 ml 19.21 ml/m LA Biplane Vol: 52.7 ml 22.85 ml/m  AORTIC VALVE AV Area (Vmax): 2.48 cm AV Vmax:        133.00 cm/s AV Peak Grad:   7.1 mmHg LVOT Vmax:      105.00 cm/s LVOT Vmean:     75.300 cm/s LVOT VTI:       0.197 m  AORTA Ao Root diam: 3.60 cm Ao Asc diam:  3.60 cm MITRAL VALVE MV Area (PHT): 3.83 cm    SHUNTS MV Decel Time: 198 msec    Systemic VTI:  0.20 m MV E velocity: 67.60 cm/s  Systemic Diam: 2.00 cm MV A velocity: 78.60 cm/s MV E/A ratio:  0.86  Riley Lam MD Electronically signed by Riley Lam MD Signature Date/Time: 04/26/2022/12:28:43 PM    Final    MR ANGIO HEAD WO CONTRAST  Result Date: 04/26/2022 CLINICAL DATA:  Sudden onset left neck, arm, and leg numbness; no acute infarct on MRI EXAM: MRA HEAD WITHOUT CONTRAST TECHNIQUE: Angiographic images of the Circle of Willis were acquired using MRA technique without intravenous contrast. COMPARISON:  No prior MRA, correlation is made with MRI 04/25/2022 FINDINGS: Anterior circulation: Both internal carotid arteries are patent to the termini, without significant stenosis. A1 segments patent. Normal anterior communicating artery. Anterior cerebral arteries are patent to their distal aspects. No M1 stenosis or occlusion. Normal MCA bifurcations. Distal MCA branches perfused and symmetric. Posterior circulation: Vertebral arteries patent to the vertebrobasilar junction without stenosis. Basilar patent to its distal aspect. Superior cerebellar arteries patent bilaterally. Patent P1 segments. PCAs perfused to their distal aspects without stenosis. The right posterior communicating artery is visualized. Anatomic variants: None significant IMPRESSION: No intracranial  large vessel occlusion or significant stenosis. Electronically Signed   By: Wiliam Ke M.D.   On: 04/26/2022 02:30   MR BRAIN WO CONTRAST  Result Date: 04/25/2022 CLINICAL DATA:  Neuro deficit, acute, stroke suspected EXAM: MRI HEAD WITHOUT CONTRAST TECHNIQUE: Multiplanar, multiecho pulse sequences of the brain and surrounding structures were obtained without intravenous contrast. COMPARISON:  None Available. FINDINGS: Brain: There is no acute infarction or intracranial hemorrhage. There is no intracranial mass, mass effect, or edema. There is no hydrocephalus or extra-axial fluid collection. Ventricles and sulci are within normal limits in size and configuration. Minimal patchy foci of T2 hyperintensity in the supratentorial white matter likely reflect nonspecific gliosis/demyelination. Vascular: Major vessel flow voids at the skull base are preserved. Skull and [place]izon cervical spine: Normal marrow signal is preserved. Sinuses/Orbits: Paranasal sinus mucosal thickening. Orbits are unremarkable. Other: Sella is unremarkable.  Mastoid air cells are clear. IMPRESSION: No acute infarction, hemorrhage, or mass. Minimal foci of nonspecific gliosis/demyelination in the cerebral white matter possibly reflecting chronic microvascular ischemic changes. Electronically Signed   By: Guadlupe Spanish M.D.   On: 04/25/2022 18:51   DG Chest 2 View  Result Date: 04/25/2022 CLINICAL DATA:  Tingling left side of body EXAM: CHEST - 2 VIEW COMPARISON:  09/18/2003 FINDINGS: The heart size and mediastinal contours are within normal limits. Both lungs are clear. The visualized skeletal structures are unremarkable. IMPRESSION: No active cardiopulmonary disease. Electronically Signed   By: Ernie Avena M.D.   On: 04/25/2022 15:00   CT Head Wo Contrast  Result Date: 04/25/2022 CLINICAL DATA:  Sudden onset of left neck, arm and leg tingling. EXAM: CT HEAD WITHOUT CONTRAST TECHNIQUE: Contiguous axial images were obtained  from the base of the skull through the vertex without intravenous contrast. RADIATION DOSE REDUCTION: This exam was performed according to the departmental dose-optimization program which includes automated exposure control, adjustment of the mA and/or kV according to patient size and/or use of iterative reconstruction technique. COMPARISON:  None Available. FINDINGS: Brain: No evidence of acute infarction, hemorrhage, hydrocephalus, extra-axial collection or mass lesion/mass effect. Vascular: No hyperdense vessel or unexpected calcification. Skull: Normal. Negative for fracture or focal lesion. Sinuses/Orbits: Globes and orbits are unremarkable. Visualized sinuses are clear. Other: None. IMPRESSION: Normal unenhanced CT scan of the brain. Electronically Signed   By: Amie Portland M.D.   On: 04/25/2022 14:42     Discharge Exam: Vitals:   04/26/22 0840 04/26/22 1254  BP: (!) 191/119 (!) 180/92  Pulse: 90 91  Resp: 20 17  Temp: 98.2 F (36.8 C)   SpO2: 95% 97%    General: Pt is alert, awake, not in acute distress CNS: Alert, oriented, normal speech, without focal deficits Psych: Normal mood and affect, stable judgement and insight     The results of significant diagnostics from this hospitalization (including imaging, microbiology, ancillary and laboratory) are listed below for reference.     Microbiology: Recent Results (from the past 240 hour(s))  Resp Panel by RT-PCR (Flu A&B, Covid) Anterior Nasal Swab     Status: None   Collection Time: 04/25/22  2:57 PM   Specimen: Anterior Nasal Swab  Result Value Ref Range Status   SARS Coronavirus 2 by RT PCR NEGATIVE NEGATIVE Final    Comment: (NOTE) SARS-CoV-2 target nucleic acids are NOT DETECTED.  The SARS-CoV-2 RNA is generally detectable in [place] respiratory specimens during the acute phase of infection. The lowest concentration of SARS-CoV-2 viral copies this assay can detect is 138 copies/mL. A negative result does not preclude  SARS-Cov-2 infection and should not be used as the sole basis for treatment or other patient management decisions. A negative result may occur with  improper specimen collection/handling, submission of specimen other than nasopharyngeal swab, presence of viral mutation(s) within the areas targeted by this assay, and inadequate number of viral copies(<138 copies/mL). A negative result must be combined with clinical observations, patient history, and epidemiological information. The expected result is Negative.  Fact Sheet for Patients:  BloggerCourse.com  Fact Sheet for Healthcare Providers:  SeriousBroker.it  This test is no t yet approved or cleared by the [place] FDA and  has been authorized for detection and/or diagnosis of SARS-CoV-2 by FDA under an Emergency Use Authorization (EUA). This EUA will remain  in effect (meaning this test can be used) for the duration of the COVID-19 declaration under Section 564(b)(1) of the Act, 21 U.S.C.section 360bbb-3(b)(1), unless the authorization is terminated  or revoked sooner.       Influenza A by PCR NEGATIVE NEGATIVE Final   Influenza B by PCR NEGATIVE NEGATIVE Final    Comment: (NOTE) The Xpert Xpress SARS-CoV-2/FLU/RSV plus assay is intended as an aid in the diagnosis of influenza from Nasopharyngeal swab specimens and should not be used as a sole basis for treatment. Nasal washings and aspirates are unacceptable for Xpert Xpress SARS-CoV-2/FLU/RSV testing.  Fact Sheet for Patients: BloggerCourse.com  Fact Sheet for Healthcare Providers: SeriousBroker.it  This test is not yet approved or cleared by the [place] FDA and has been authorized for detection and/or diagnosis of SARS-CoV-2 by FDA under an Emergency Use Authorization (EUA). This EUA will remain in effect (meaning this test can be used) for the duration of  the COVID-19 declaration under Section 564(b)(1) of the Act, 21 U.S.C. section 360bbb-3(b)(1), unless the authorization is terminated or revoked.  Performed at [facility], [address] [place], [place] [address]      Labs: BNP (last 3 results) No results for input(s): "BNP" in the last 8760 hours. Basic Metabolic Panel: Recent Labs  Lab 04/25/22 1355 04/25/22 1404 04/25/22 1658 04/25/22 2212 04/26/22 0355  NA 134* 133*  --   --  135  K 6.1* 6.9*  --  3.9 3.8  CL 103 102  --   --  102  CO2 22  --   --   --  22  GLUCOSE 260* 272*  --   --  317*  BUN 22* 35*  --   --  19  CREATININE 1.08 1.10  --   --  1.19  CALCIUM 8.2*  --   --   --  8.3*  MG  --   --  2.2  --   --    Liver Function Tests: Recent Labs  Lab 04/25/22 1355 04/26/22 0355  AST 58* 61*  ALT 41 62*  ALKPHOS 42 37*  BILITOT 1.3* 1.0  PROT 5.6* 5.7*  ALBUMIN 3.5 3.2*   No results for input(s): "LIPASE", "AMYLASE" in the last 168 hours. No results for input(s): "AMMONIA" in the last 168 hours. CBC: Recent Labs  Lab 04/25/22 1355 04/25/22 1404 04/26/22 0355  WBC 14.2*  --  9.0  NEUTROABS 10.3*  --   --   HGB 16.8 15.6 15.5  HCT 46.4 46.0 42.0  MCV 90.6  --  90.1  PLT 281  --  252   Cardiac Enzymes: No results for input(s): "CKTOTAL", "CKMB", "CKMBINDEX", "TROPONINI" in the last 168 hours. BNP: Invalid input(s): "POCBNP" CBG: Recent Labs  Lab 04/25/22 1341 04/26/22 0125 04/26/22 0821 04/26/22 1220  GLUCAP 226* 296* 274* 220*   D-Dimer No results for input(s): "DDIMER" in the last 72 hours. Hgb A1c Recent Labs    04/26/22 0355  HGBA1C 7.1*   Lipid Profile Recent Labs    04/26/22 0355  CHOL 242*  HDL 36*  LDLCALC UNABLE TO CALCULATE IF TRIGLYCERIDE OVER 400 mg/dL  TRIG 098*  CHOLHDL 6.7  LDLDIRECT 134.7*   Thyroid function studies No results for input(s): "TSH", "T4TOTAL", "T3FREE", "THYROIDAB" in the last 72 hours.  Invalid input(s): "FREET3" Anemia work  up No results for input(s): "VITAMINB12", "FOLATE", "FERRITIN", "TIBC", "IRON", "RETICCTPCT" in the last 72 hours. Urinalysis    Component Value Date/Time   COLORURINE YELLOW 04/25/2022 1457   APPEARANCEUR HAZY (A) 04/25/2022 1457   LABSPEC 1.011 04/25/2022 1457   PHURINE 6.0 04/25/2022 1457   GLUCOSEU >=500 (A) 04/25/2022 1457   GLUCOSEU NEGATIVE 07/29/2014 1029   HGBUR NEGATIVE 04/25/2022 1457   BILIRUBINUR NEGATIVE 04/25/2022 1457   KETONESUR NEGATIVE 04/25/2022 1457   PROTEINUR NEGATIVE 04/25/2022 1457   UROBILINOGEN 0.2 07/29/2014 1029   NITRITE NEGATIVE 04/25/2022 1457   LEUKOCYTESUR NEGATIVE 04/25/2022 1457   Sepsis Labs Recent Labs  Lab 04/25/22 1355 04/26/22 0355  WBC 14.2* 9.0   Microbiology Recent Results (from the past 240 hour(s))  Resp Panel by RT-PCR (Flu A&B, Covid) Anterior Nasal Swab     Status: None   Collection Time: 04/25/22  2:57 PM   Specimen: Anterior Nasal Swab  Result Value Ref Range Status   SARS Coronavirus 2 by RT PCR NEGATIVE NEGATIVE Final    Comment: (NOTE) SARS-CoV-2 target nucleic acids are NOT DETECTED.  The SARS-CoV-2 RNA is generally detectable in Hansen respiratory specimens during the acute phase of infection. The lowest concentration of SARS-CoV-2 viral copies this assay can detect is 138 copies/mL. A negative result does not preclude SARS-Cov-2 infection and should not be used as the sole basis for treatment or other patient management decisions. A negative result may occur with  improper specimen collection/handling, submission of specimen other than nasopharyngeal swab, presence of viral mutation(s) within the areas targeted by this assay, and inadequate number of viral copies(<138 copies/mL). A negative result must be combined with clinical observations, patient history, and epidemiological information. The expected result is Negative.  Fact Sheet for Patients:  BloggerCourse.com  Fact Sheet for  Healthcare Providers:  SeriousBroker.it  This test is no t yet approved or  cleared by the [place] and  has been authorized for detection and/or diagnosis of SARS-CoV-2 by FDA under an Emergency Use Authorization (EUA). This EUA will remain  in effect (meaning this test can be used) for the duration of the COVID-19 declaration under Section 564(b)(1) of the Act, 21 U.S.C.section 360bbb-3(b)(1), unless the authorization is terminated  or revoked sooner.       Influenza A by PCR NEGATIVE NEGATIVE Final   Influenza B by PCR NEGATIVE NEGATIVE Final    Comment: (NOTE) The Xpert Xpress SARS-CoV-2/FLU/RSV plus assay is intended as an aid in the diagnosis of influenza from Nasopharyngeal swab specimens and should not be used as a sole basis for treatment. Nasal washings and aspirates are unacceptable for Xpert Xpress SARS-CoV-2/FLU/RSV testing.  Fact Sheet for Patients: BloggerCourse.com  Fact Sheet for Healthcare Providers: SeriousBroker.it  This test is not yet approved or cleared by the [place] FDA and has been authorized for detection and/or diagnosis of SARS-CoV-2 by FDA under an Emergency Use Authorization (EUA). This EUA will remain in effect (meaning this test can be used) for the duration of the COVID-19 declaration under Section 564(b)(1) of the Act, 21 U.S.C. section 360bbb-3(b)(1), unless the authorization is terminated or revoked.  Performed at [facility], [address], [place], [place] [address]      Patient was seen and examined on the day of discharge and was found to be in stable condition. Time coordinating discharge: 40 minutes including assessment and coordination of care, as well as examination of the patient.   SIGNED:  Noralee Stain, DO [facility] 04/26/2022, 2:10 PM

## 2022-04-26 NOTE — Progress Notes (Signed)
Bilateral carotid duplex study completed. Please see CV Proc for preliminary results.  Jawaan Adachi BS, RVT 04/26/2022 10:54 AM

## 2022-04-26 NOTE — Progress Notes (Addendum)
Inpatient Diabetes Program Recommendations  AACE/ADA: New Consensus Statement on Inpatient Glycemic Control (2015)  Target Ranges:  Prepandial:   less than 140 mg/dL      Peak postprandial:   less than 180 mg/dL (1-2 hours)      Critically ill patients:  140 - 180 mg/dL   Lab Results  Component Value Date   GLUCAP 274 (H) 04/26/2022   HGBA1C 7.1 (H) 04/26/2022    Review of Glycemic Control  Latest Reference Range & Units 04/25/22 13:41 04/26/22 01:25 04/26/22 08:21  Glucose-Capillary 70 - 99 mg/dL 353 (H) 614 (H) 431 (H)   Diabetes history: Borderline?  Outpatient Diabetes medications: Pred. Taper started on 04/25/22 Current orders for Inpatient glycemic control:  Novolog 0-6 units tid with meals and HS Inpatient Diabetes Program Recommendations:    May consider adding Semglee 15 units daily while in the hospital.  Based on A1C, this may be new diagnosis of DM?  At discharge, needs close f/u with PCP.  Consider adding Metformin 500 mg bid.  Also  may be a good candidate for weekly GLP injection?    Thanks,  Beryl Meager, RN, BC-ADM Inpatient Diabetes Coordinator Pager [phone]  (8a-5p)   [phone]- Spoke to patient and wife regarding new diagnosis of DM. Discussed A1C results with him and explained what an A1C is, basic pathophysiology of DM Type 2, basic home care, importance of checking CBGs and maintaining good CBG control to prevent long-term and short-term complications.  Patient plans to f/u with PCP regarding DM management and potential medications.  We discussed different classes of medications for DM and he plans to discuss further with PCP. Patient was started on metformin by hospitialist. Will order LWWD booklet as well.

## 2022-04-26 NOTE — ED Provider Notes (Signed)
Blood pressure (!) 167/111, pulse 75, temperature 98.5 F (36.9 C), temperature source Oral, resp. rate 16, height 6\' 2"  (1.88 m), weight 104.3 kg, SpO2 99 %.  Assuming care from Dr. .  In short, Steven Mcclain is a 56 y.o. male with a chief complaint of Numbness .  Refer to the original H&P for additional details.  The current plan of care is to follow up on Neurology recommendation after consultation. Hyperkalemia corrected.  12:06 AM  Spoke with Neuro. Plan for admit.   Discussed patient's case with TRH, Dr. 53 to request admission. Patient and family (if present) updated with plan.   I reviewed all nursing notes, vitals, pertinent old records, EKGs, labs, imaging (as available).     Antionette Char, MD 04/26/22 [phone]

## 2022-05-12 ENCOUNTER — Other Ambulatory Visit: Payer: Self-pay | Admitting: Nurse Practitioner

## 2022-05-17 ENCOUNTER — Other Ambulatory Visit: Payer: Self-pay

## 2022-05-17 DIAGNOSIS — Z130 Encounter for screening for diseases of the blood and blood-forming organs and certain disorders involving the immune mechanism: Secondary | ICD-10-CM

## 2022-05-17 DIAGNOSIS — Z0000 Encounter for general adult medical examination without abnormal findings: Secondary | ICD-10-CM

## 2022-05-18 ENCOUNTER — Other Ambulatory Visit: Payer: 59

## 2022-05-18 DIAGNOSIS — Z130 Encounter for screening for diseases of the blood and blood-forming organs and certain disorders involving the immune mechanism: Secondary | ICD-10-CM

## 2022-05-18 DIAGNOSIS — Z0000 Encounter for general adult medical examination without abnormal findings: Secondary | ICD-10-CM

## 2022-05-19 LAB — CBC WITH DIFFERENTIAL/PLATELET
Basophils Absolute: 0.1 10*3/uL (ref 0.0–0.2)
Basos: 1 %
EOS (ABSOLUTE): 0.6 10*3/uL — ABNORMAL HIGH (ref 0.0–0.4)
Eos: 5 %
Hematocrit: 45.3 % (ref 37.5–51.0)
Hemoglobin: 15.9 g/dL (ref 13.0–17.7)
Immature Grans (Abs): 0 10*3/uL (ref 0.0–0.1)
Immature Granulocytes: 0 %
Lymphocytes Absolute: 1.9 10*3/uL (ref 0.7–3.1)
Lymphs: 18 %
MCH: 32.4 pg (ref 26.6–33.0)
MCHC: 35.1 g/dL (ref 31.5–35.7)
MCV: 92 fL (ref 79–97)
Monocytes Absolute: 0.9 10*3/uL (ref 0.1–0.9)
Monocytes: 9 %
Neutrophils Absolute: 7.1 10*3/uL — ABNORMAL HIGH (ref 1.4–7.0)
Neutrophils: 67 %
Platelets: 335 10*3/uL (ref 150–450)
RBC: 4.9 x10E6/uL (ref 4.14–5.80)
RDW: 13.8 % (ref 11.6–15.4)
WBC: 10.5 10*3/uL (ref 3.4–10.8)

## 2022-05-19 LAB — LIPID PANEL
Chol/HDL Ratio: 3.4 ratio (ref 0.0–5.0)
Cholesterol, Total: 127 mg/dL (ref 100–199)
HDL: 37 mg/dL — ABNORMAL LOW (ref >39)
LDL Chol Calc (NIH): 67 mg/dL (ref 0–99)
Triglycerides: 132 mg/dL (ref 0–149)
VLDL Cholesterol Cal: 23 mg/dL (ref 5–40)

## 2022-05-19 LAB — COMPREHENSIVE METABOLIC PANEL
ALT: 25 IU/L (ref 0–44)
AST: 19 IU/L (ref 0–40)
Albumin/Globulin Ratio: 2.5 — ABNORMAL HIGH (ref 1.2–2.2)
Albumin: 4.9 g/dL (ref 3.8–4.9)
Alkaline Phosphatase: 56 IU/L (ref 44–121)
BUN/Creatinine Ratio: 15 (ref 9–20)
BUN: 18 mg/dL (ref 6–24)
Bilirubin Total: 1.2 mg/dL (ref 0.0–1.2)
CO2: 22 mmol/L (ref 20–29)
Calcium: 9.9 mg/dL (ref 8.7–10.2)
Chloride: 100 mmol/L (ref 96–106)
Creatinine, Ser: 1.17 mg/dL (ref 0.76–1.27)
Globulin, Total: 2 g/dL (ref 1.5–4.5)
Glucose: 125 mg/dL — ABNORMAL HIGH (ref 70–99)
Potassium: 4.3 mmol/L (ref 3.5–5.2)
Sodium: 140 mmol/L (ref 134–144)
Total Protein: 6.9 g/dL (ref 6.0–8.5)
eGFR: 74 mL/min/{1.73_m2} (ref >59)

## 2022-05-19 LAB — TSH: TSH: 1.99 u[IU]/mL (ref 0.450–4.500)

## 2022-05-19 LAB — HEMOGLOBIN A1C
Est. average glucose Bld gHb Est-mCnc: 148 mg/dL
Hgb A1c MFr Bld: 6.8 % — ABNORMAL HIGH (ref 4.8–5.6)

## 2022-05-22 NOTE — Progress Notes (Signed)
Mildly low HDL. HgbA1c 6.8. other labs look good. Discuss at visit 05/25/2022.

## 2022-05-25 ENCOUNTER — Ambulatory Visit (INDEPENDENT_AMBULATORY_CARE_PROVIDER_SITE_OTHER): Payer: 59 | Admitting: Nurse Practitioner

## 2022-05-25 ENCOUNTER — Encounter: Payer: Self-pay | Admitting: Nurse Practitioner

## 2022-05-25 VITALS — BP 144/88 | HR 76 | Ht 74.02 in | Wt 224.8 lb

## 2022-05-25 DIAGNOSIS — Z23 Encounter for immunization: Secondary | ICD-10-CM | POA: Diagnosis not present

## 2022-05-25 DIAGNOSIS — K219 Gastro-esophageal reflux disease without esophagitis: Secondary | ICD-10-CM | POA: Diagnosis not present

## 2022-05-25 DIAGNOSIS — I10 Essential (primary) hypertension: Secondary | ICD-10-CM | POA: Diagnosis not present

## 2022-05-25 DIAGNOSIS — Z0001 Encounter for general adult medical examination with abnormal findings: Secondary | ICD-10-CM

## 2022-05-25 DIAGNOSIS — E1165 Type 2 diabetes mellitus with hyperglycemia: Secondary | ICD-10-CM | POA: Diagnosis not present

## 2022-05-25 DIAGNOSIS — G459 Transient cerebral ischemic attack, unspecified: Secondary | ICD-10-CM

## 2022-05-25 DIAGNOSIS — Z1211 Encounter for screening for malignant neoplasm of colon: Secondary | ICD-10-CM

## 2022-05-25 MED ORDER — LOSARTAN POTASSIUM 25 MG PO TABS: 25.0000 mg | ORAL_TABLET | Freq: Every day | ORAL | 1 refills | Status: DC

## 2022-05-25 MED ORDER — PANTOPRAZOLE SODIUM 40 MG PO TBEC: 40.0000 mg | DELAYED_RELEASE_TABLET | Freq: Every day | ORAL | 3 refills | Status: DC

## 2022-05-25 NOTE — Progress Notes (Signed)
Complete physical exam   Patient: Steven Mcclain   DOB: 1966-10-17   56 y.o. Male  MRN: [id number] Visit Date: 05/25/2022    Chief Complaint  Patient presents with   Annual Exam   Subjective    Steven Mcclain is a 56 y.o. male who presents today for a complete physical exam.  He reports consuming a  generally healthy  diet.  Exercising 3 to 4 times per week   He generally feels fairly well. He does have additional problems to discuss today.   HPI  Annual physical -routine, fasting labs done recently --BS 125 with HgbA1c 6.8 --mildly low HDL with other normal lipids  ---now on statin after admission for TIA --other labs good -had severe back pain at initial visit -recently seen in ER for suspected TIA -carotid doppler was negative for evidence of stenosis or occlusion  -MRA angio of brain showed no evidence of occlusion or stenosis -MRI brain did indicate chronic microvascular changes.  -multiple abnormal labs which have corrected themselves  -will schedule folow up with neurology -  --does have some intermittent tingling around his mouth.  --has increase fatigue  --does feel down  --now on statin.  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  Tobacco Use   Smoking status: Former    Types: Cigars   Smokeless tobacco: Never   Tobacco comments:     4 cigars per year  Substance and Sexual Activity   Alcohol use: Yes    Comment: social   Drug use: Not Currently   Sexual activity: Not on file  Other Topics Concern   Not on file  Social History Narrative   Not on file   Social Determinants of Health   Financial Resource Strain: Not on file  Food Insecurity: Not on file  Transportation Needs: Not on file  Physical Activity: Not on file  Stress: Not on file  Social Connections: Not on file  Intimate Partner Violence: Not on file   Family Status  Relation Name Status   Father  (Not Specified)       CABG and CAD at 38   Other grandfather Deceased at age 40       MI and stroke   Mother  (Not Specified)   MGM  (Not Specified)   Family History  Problem Relation Age of Onset   Coronary artery disease Father    Diabetes Father    Heart disease Father    Hyperlipidemia Father    Cancer Other    Mental retardation Mother    Stroke Maternal Grandmother    No Known Allergies  Patient Care Team: Ronnell Freshwater, NP as PCP - General (Family Medicine)   Medications: Outpatient Medications Prior to Visit  Medication Sig   acetaminophen (TYLENOL) 500 MG tablet Take 1,000  mg by mouth 3 (three) times daily as needed (back pain).   albuterol (VENTOLIN HFA) 108 (90 Base) MCG/ACT inhaler Inhale 2 puffs into the lungs every 4 (four) hours as needed for wheezing or shortness of breath. (Patient taking differently: Inhale 2 puffs into the lungs as needed for wheezing or shortness of breath.)   atorvastatin (LIPITOR) 80 MG tablet Take 1 tablet (80 mg total) by mouth daily.   B Complex Vitamins (B COMPLEX PO) Take 1 tablet by mouth daily.   Calcium Carbonate Antacid (TUMS PO) Take 1 tablet by mouth as needed (acid reflux).   clopidogrel (PLAVIX) 75 MG tablet Take 1 tablet (75 mg total) by mouth daily.   cyclobenzaprine (FLEXERIL) 10 MG tablet Take 1 tablet (10 mg total) by mouth 2 (two) times daily as needed for muscle spasms. (Patient  taking differently: Take 10 mg by mouth at bedtime.)   diclofenac (VOLTAREN) 50 MG EC tablet Take 1 tablet (50 mg total) by mouth 2 (two) times daily. (Patient taking differently: Take 50 mg by mouth at bedtime.)   fenofibrate 160 MG tablet Take 1 tablet (160 mg total) by mouth daily.   metFORMIN (GLUCOPHAGE) 500 MG tablet Take 1 tablet (500 mg total) by mouth 2 (two) times daily with a meal.   Multiple Vitamin (MULTI VITAMIN DAILY) TABS Take 1 tablet by mouth daily.   OVER THE COUNTER MEDICATION Apply 1 Application topically 3 (three) times daily as needed (itching). Benadryl cream ex   tadalafil (CIALIS) 20 MG tablet Take 1 tablet po prn (Patient taking differently: Take 10 mg by mouth See admin instructions. Take 10 mg by mouth every three days)   VITAMIN D PO Take 1 tablet by mouth daily.   [DISCONTINUED] Omeprazole 20 MG TBDD Take 1 capsule by mouth daily. (Patient taking differently: Take 20 mg by mouth daily.)   [DISCONTINUED] predniSONE (STERAPRED UNI-PAK 21 TAB) 10 MG (21) TBPK tablet 6 day taper - take by mouth as directed for 6 days   No facility-administered medications prior to visit.    Review of Systems  Constitutional:  Positive for fatigue. Negative for activity change, chills and fever.  HENT:  Negative for congestion, postnasal drip, rhinorrhea, sinus pressure, sinus pain, sneezing and sore throat.   Eyes: Negative.   Respiratory:  Negative for cough, shortness of breath and wheezing.   Cardiovascular:  Negative for chest pain and palpitations.  Gastrointestinal:  Negative for constipation, diarrhea, nausea and vomiting.  Endocrine: Negative for cold intolerance, heat intolerance, polydipsia and polyuria.  Genitourinary:  Negative for dysuria, frequency and urgency.  Musculoskeletal:  Negative for back pain and myalgias.  Skin:  Negative for rash.  Allergic/Immunologic: Negative for environmental allergies.  Neurological:  Negative for dizziness, weakness and headaches.        Recently hospitalized for TIA. Has some sresidual tingling around his mouth, but has no other lingering symptoms.   Psychiatric/Behavioral:  The patient is nervous/anxious.     Last CBC Lab Results  Component Value Date   WBC 10.5 05/18/2022   HGB 15.9 05/18/2022   HCT 45.3 05/18/2022   MCV 92 05/18/2022   MCH 32.4 05/18/2022   RDW 13.8 05/18/2022   PLT 335 38/93/7342   Last metabolic panel Lab Results  Component Value Date   GLUCOSE 125 (H) 05/18/2022   NA 140 05/18/2022   K 4.3 05/18/2022   CL 100 05/18/2022   CO2 22 05/18/2022   BUN 18 05/18/2022   CREATININE 1.17 05/18/2022  EGFR 74 05/18/2022   CALCIUM 9.9 05/18/2022   PROT 6.9 05/18/2022   ALBUMIN 4.9 05/18/2022   LABGLOB 2.0 05/18/2022   AGRATIO 2.5 (H) 05/18/2022   BILITOT 1.2 05/18/2022   ALKPHOS 56 05/18/2022   AST 19 05/18/2022   ALT 25 05/18/2022   ANIONGAP 11 04/26/2022   Last lipids Lab Results  Component Value Date   CHOL 127 05/18/2022   HDL 37 (L) 05/18/2022   LDLCALC 67 05/18/2022   LDLDIRECT 134.7 (H) 04/26/2022   TRIG 132 05/18/2022   CHOLHDL 3.4 05/18/2022   Last hemoglobin A1c Lab Results  Component Value Date   HGBA1C 6.8 (H) 05/18/2022   Last thyroid functions Lab Results  Component Value Date   TSH 1.990 05/18/2022       Objective     Today's Vitals   05/25/22 1022 05/25/22 1103  BP: (!) 154/93 (!) 144/88  Pulse: 76   SpO2: 97%   Weight: 224 lb 12.8 oz (102 kg)   Height: 6' 2.02" (1.88 m)    Body mass index is 28.85 kg/m.   BP Readings from Last 3 Encounters:  05/25/22 (!) 144/88  04/26/22 (!) 182/90  04/20/22 (!) 167/93    Wt Readings from Last 3 Encounters:  05/25/22 224 lb 12.8 oz (102 kg)  04/25/22 230 lb (104.3 kg)  04/20/22 231 lb 12.8 oz (105.1 kg)     Physical Exam Vitals and nursing note reviewed.  Constitutional:      Appearance: Normal appearance. He is well-developed.  HENT:     Head: Normocephalic and atraumatic.     Right Ear:  Tympanic membrane, ear canal and external ear normal.     Left Ear: Tympanic membrane, ear canal and external ear normal.     Nose: Nose normal.     Mouth/Throat:     Mouth: Mucous membranes are moist.     Pharynx: Oropharynx is clear.  Eyes:     Extraocular Movements: Extraocular movements intact.     Conjunctiva/sclera: Conjunctivae normal.     Pupils: Pupils are equal, round, and reactive to light.  Neck:     Vascular: No carotid bruit.  Cardiovascular:     Rate and Rhythm: Normal rate and regular rhythm.     Pulses:          Dorsalis pedis pulses are 1+ on the right side and 1+ on the left side.       Posterior tibial pulses are 1+ on the right side and 1+ on the left side.     Heart sounds: Normal heart sounds.  Pulmonary:     Effort: Pulmonary effort is normal.     Breath sounds: Normal breath sounds.  Abdominal:     General: Bowel sounds are normal. There is no distension.     Palpations: Abdomen is soft. There is no mass.     Tenderness: There is no abdominal tenderness. There is no right CVA tenderness, left CVA tenderness, guarding or rebound.     Hernia: No hernia is present.  Musculoskeletal:        General: Normal range of motion.     Cervical back: Normal range of motion and neck supple.     Right foot: Normal range of motion. No deformity or bunion.     Left foot: Normal range of motion. No deformity or bunion.  Feet:     Right foot:     Protective Sensation: 10 sites tested.  10 sites sensed.  Skin integrity: Skin integrity normal.     Toenail Condition: Right toenails are normal.     Left foot:     Protective Sensation: 10 sites tested.  10 sites sensed.     Skin integrity: Skin integrity normal.     Toenail Condition: Left toenails are normal.  Lymphadenopathy:     Cervical: No cervical adenopathy.  Skin:    General: Skin is warm and dry.     Capillary Refill: Capillary refill takes less than 2 seconds.  Neurological:     General: No focal deficit  present.     Mental Status: He is alert and oriented to person, place, and time.     Cranial Nerves: No cranial nerve deficit.     Sensory: No sensory deficit.     Motor: No weakness.     Coordination: Coordination normal.     Gait: Gait normal.     Deep Tendon Reflexes: Reflexes normal.  Psychiatric:        Mood and Affect: Mood normal.        Behavior: Behavior normal.        Thought Content: Thought content normal.        Judgment: Judgment normal.     Last depression screening scores    05/25/2022   10:25 AM 04/20/2022   11:21 AM 08/01/2014    5:22 PM  PHQ 2/9 Scores  PHQ - 2 Score 4 0 0  PHQ- 9 Score 8 2    Last fall risk screening    08/01/2014    5:22 PM  [place] in the past year? No      Assessment & Plan    1. Encounter for general adult medical examination with abnormal findings Annual physical today.  2. Type 2 diabetes mellitus with hyperglycemia, without long-term current use of insulin (HCC) Recent hemoglobin A1c 6.8.  Continue metformin as previously prescribed.  Patient has appointment for diabetic eye exam upcoming.  3. Essential hypertension Start losartan 25 mg daily.  Recommend DASH diet.  Limit intake of sodium and increase water intake daily.  Patient to check blood pressure daily.  Goal is for blood pressure to be 140/80 or better. - losartan (COZAAR) 25 MG tablet; Take 1 tablet (25 mg total) by mouth daily.  Dispense: 90 tablet; Refill: 1  4. Gastroesophageal reflux disease without esophagitis Take pantoprazole 40 mg daily. - pantoprazole (PROTONIX) 40 MG tablet; Take 1 tablet (40 mg total) by mouth daily.  Dispense: 30 tablet; Refill: 3  5. TIA (transient ischemic attack) Patient recently hospitalized for TIA.  No lingering symptoms.  He will follow-up with neurology.  Patient will call to schedule appointment.  6. Screening for colon cancer Refer to GI for screening colonoscopy. - Ambulatory referral to Gastroenterology  7. Need  for shingles vaccine Initial shingles vaccine administered during today's visit. - Varicella-zoster vaccine IM  8. Need for Tdap vaccination Tdap vaccine administered during today's visit.   - Tdap vaccine greater than or equal to 7yo IM    Immunization History  Administered Date(s) Administered   Tdap 07/12/2011, 05/25/2022   Zoster Recombinat (Shingrix) 05/25/2022    Health Maintenance  Topic Date Due   COVID-19 Vaccine (1) Never done   FOOT EXAM  Never done   OPHTHALMOLOGY EXAM  Never done   COLONOSCOPY (Pts 45-45yr Insurance coverage will need to be confirmed)  Never done   INFLUENZA VACCINE  06/07/2022   Zoster Vaccines- Shingrix (2 of 2)  07/20/2022   HEMOGLOBIN A1C  11/18/2022   TETANUS/TDAP  05/25/2032   Hepatitis C Screening  Completed   HIV Screening  Completed   HPV VACCINES  Aged Out    Discussed health benefits of physical activity, and encouraged him to engage in regular exercise appropriate for his age and condition.  Problem List Items Addressed This Visit       Cardiovascular and Mediastinum   TIA (transient ischemic attack)   Relevant Medications   losartan (COZAAR) 25 MG tablet   Essential hypertension   Relevant Medications   losartan (COZAAR) 25 MG tablet     Digestive   Gastroesophageal reflux disease without esophagitis   Relevant Medications   pantoprazole (PROTONIX) 40 MG tablet     Endocrine   Type 2 diabetes mellitus with hyperglycemia, without long-term current use of insulin (HCC)   Relevant Medications   losartan (COZAAR) 25 MG tablet   Other Visit Diagnoses     Encounter for general adult medical examination with abnormal findings    -  Primary   Screening for colon cancer       Relevant Orders   Ambulatory referral to Gastroenterology   Need for shingles vaccine       Relevant Orders   Varicella-zoster vaccine IM (Completed)   Need for Tdap vaccination       Relevant Orders   Tdap vaccine greater than or equal to 7yo IM  (Completed)        Return in about 3 months (around 08/25/2022) for diabetes with HgbA1c check.        Ronnell Freshwater, NP  [facility] at [facility] [phone] (phone) [phone] (fax)  [place]

## 2022-06-05 DIAGNOSIS — E1165 Type 2 diabetes mellitus with hyperglycemia: Secondary | ICD-10-CM | POA: Insufficient documentation

## 2022-06-05 DIAGNOSIS — I10 Essential (primary) hypertension: Secondary | ICD-10-CM | POA: Insufficient documentation

## 2022-07-28 ENCOUNTER — Telehealth: Payer: Self-pay

## 2022-07-28 NOTE — Telephone Encounter (Signed)
These Rx are by another provider

## 2022-07-28 NOTE — Telephone Encounter (Signed)
Patient called requesting his medication metrformin, atorvastin, fenofibrate, and clopidogrel sent to pharmacy.

## 2022-07-29 ENCOUNTER — Other Ambulatory Visit: Payer: Self-pay | Admitting: Nurse Practitioner

## 2022-07-29 DIAGNOSIS — G459 Transient cerebral ischemic attack, unspecified: Secondary | ICD-10-CM

## 2022-07-29 DIAGNOSIS — E782 Mixed hyperlipidemia: Secondary | ICD-10-CM

## 2022-07-29 DIAGNOSIS — E1165 Type 2 diabetes mellitus with hyperglycemia: Secondary | ICD-10-CM

## 2022-07-29 MED ORDER — ATORVASTATIN CALCIUM 80 MG PO TABS: 80.0000 mg | ORAL_TABLET | Freq: Every day | ORAL | 1 refills | Status: AC

## 2022-07-29 MED ORDER — CLOPIDOGREL BISULFATE 75 MG PO TABS: 75.0000 mg | ORAL_TABLET | Freq: Every day | ORAL | 1 refills | Status: AC

## 2022-07-29 MED ORDER — METFORMIN HCL 500 MG PO TABS: 500.0000 mg | ORAL_TABLET | Freq: Two times a day (BID) | ORAL | 1 refills | Status: AC

## 2022-07-29 MED ORDER — FENOFIBRATE 160 MG PO TABS: 160.0000 mg | ORAL_TABLET | Freq: Every day | ORAL | 1 refills | Status: AC

## 2022-07-29 NOTE — Progress Notes (Signed)
Sent new 90 day prescriptions for metformin, fenofibrate, atorvastatin, and fenofibrate to [facility]

## 2022-07-29 NOTE — Telephone Encounter (Signed)
Sent new 90 day prescriptions for metformin, fenofibrate, atorvastatin, and fenofibrate to [facility]  

## 2022-08-25 ENCOUNTER — Encounter: Payer: Self-pay | Admitting: Nurse Practitioner

## 2022-08-25 ENCOUNTER — Ambulatory Visit (INDEPENDENT_AMBULATORY_CARE_PROVIDER_SITE_OTHER): Payer: 59 | Admitting: Nurse Practitioner

## 2022-08-25 VITALS — BP 132/83 | HR 73 | Ht 74.2 in | Wt 230.4 lb

## 2022-08-25 DIAGNOSIS — G459 Transient cerebral ischemic attack, unspecified: Secondary | ICD-10-CM

## 2022-08-25 DIAGNOSIS — I10 Essential (primary) hypertension: Secondary | ICD-10-CM | POA: Diagnosis not present

## 2022-08-25 DIAGNOSIS — E1165 Type 2 diabetes mellitus with hyperglycemia: Secondary | ICD-10-CM

## 2022-08-25 DIAGNOSIS — Z23 Encounter for immunization: Secondary | ICD-10-CM | POA: Diagnosis not present

## 2022-08-25 DIAGNOSIS — E782 Mixed hyperlipidemia: Secondary | ICD-10-CM | POA: Diagnosis not present

## 2022-08-25 LAB — POCT GLYCOSYLATED HEMOGLOBIN (HGB A1C): HbA1c POC (<> result, manual entry): 6 % (ref 4.0–5.6)

## 2022-08-25 NOTE — Progress Notes (Signed)
Established patient visit   Patient: Steven Mcclain   DOB: January 15, 1966   56 y.o. Male  MRN: [id number] Visit Date: 08/25/2022   Chief Complaint  Patient presents with   Follow-up   Subjective    HPI  Follow up  -type 2 diabetes  -currently on Metformin BID -most recent HgbA1c 6.8 -HgbA1c today is  -needs diabetic eye exam  -due to have 2nd shingles vaccine  -declines flu shot. -has had referral to [place] for colon cancer screening - has had to hold off on this for now as his wife is unable to drive due to injury.     Medications: Outpatient Medications Prior to Visit  Medication Sig   acetaminophen (TYLENOL) 500 MG tablet Take 1,000 mg by mouth 3 (three) times daily as needed (back pain).   albuterol (VENTOLIN HFA) 108 (90 Base) MCG/ACT inhaler Inhale 2 puffs into the lungs every 4 (four) hours as needed for wheezing or shortness of breath. (Patient taking differently: Inhale 2 puffs into the lungs as needed for wheezing or shortness of breath.)   atorvastatin (LIPITOR) 80 MG tablet Take 1 tablet (80 mg total) by mouth daily.   B Complex Vitamins (B COMPLEX PO) Take 1 tablet by mouth daily.   Calcium Carbonate Antacid (TUMS PO) Take 1 tablet by mouth as needed (acid reflux).   clopidogrel (PLAVIX) 75 MG tablet Take 1 tablet (75 mg total) by mouth daily.   fenofibrate 160 MG tablet Take 1 tablet (160 mg total) by mouth daily.   losartan (COZAAR) 25 MG tablet Take 1 tablet (25 mg total) by mouth daily.   metFORMIN (GLUCOPHAGE) 500 MG tablet Take 1 tablet (500 mg total) by mouth 2 (two) times daily with a meal.   Multiple Vitamin (MULTI VITAMIN DAILY) TABS Take 1 tablet by mouth daily.   OVER THE COUNTER MEDICATION Apply 1 Application topically 3 (three) times daily as needed (itching). Benadryl cream ex   pantoprazole (PROTONIX) 40 MG tablet Take 1 tablet (40 mg total) by mouth daily.   tadalafil (CIALIS) 20 MG tablet Take 1 tablet po prn (Patient taking differently: Take  10 mg by mouth See admin instructions. Take 10 mg by mouth every three days)   VITAMIN D PO Take 1 tablet by mouth daily.   [DISCONTINUED] cyclobenzaprine (FLEXERIL) 10 MG tablet Take 1 tablet (10 mg total) by mouth 2 (two) times daily as needed for muscle spasms. (Patient taking differently: Take 10 mg by mouth at bedtime.)   [DISCONTINUED] diclofenac (VOLTAREN) 50 MG EC tablet Take 1 tablet (50 mg total) by mouth 2 (two) times daily. (Patient taking differently: Take 50 mg by mouth at bedtime.)   No facility-administered medications prior to visit.    Review of Systems  Constitutional:  Negative for activity change, chills, fatigue and fever.  HENT:  Negative for congestion, postnasal drip, rhinorrhea, sinus pressure, sinus pain, sneezing and sore throat.   Eyes: Negative.   Respiratory:  Negative for cough, shortness of breath and wheezing.   Cardiovascular:  Negative for chest pain and palpitations.  Gastrointestinal:  Negative for constipation, diarrhea, nausea and vomiting.  Endocrine: Negative for cold intolerance, heat intolerance, polydipsia and polyuria.       Blood sugars doing well    Genitourinary:  Negative for dysuria, frequency and urgency.  Musculoskeletal:  Negative for back pain and myalgias.  Skin:  Negative for rash.  Allergic/Immunologic: Negative for environmental allergies.  Neurological:  Negative for dizziness, weakness and headaches.  Psychiatric/Behavioral:  The patient is not nervous/anxious.     Last CBC Lab Results  Component Value Date   WBC 10.5 05/18/2022   HGB 15.9 05/18/2022   HCT 45.3 05/18/2022   MCV 92 05/18/2022   MCH 32.4 05/18/2022   RDW 13.8 05/18/2022   PLT 335 09/32/3557   Last metabolic panel Lab Results  Component Value Date   GLUCOSE 125 (H) 05/18/2022   NA 140 05/18/2022   K 4.3 05/18/2022   CL 100 05/18/2022   CO2 22 05/18/2022   BUN 18 05/18/2022   CREATININE 1.17 05/18/2022   EGFR 74 05/18/2022   CALCIUM 9.9 05/18/2022    PROT 6.9 05/18/2022   ALBUMIN 4.9 05/18/2022   LABGLOB 2.0 05/18/2022   AGRATIO 2.5 (H) 05/18/2022   BILITOT 1.2 05/18/2022   ALKPHOS 56 05/18/2022   AST 19 05/18/2022   ALT 25 05/18/2022   ANIONGAP 11 04/26/2022   Last lipids Lab Results  Component Value Date   CHOL 127 05/18/2022   HDL 37 (L) 05/18/2022   LDLCALC 67 05/18/2022   LDLDIRECT 134.7 (H) 04/26/2022   TRIG 132 05/18/2022   CHOLHDL 3.4 05/18/2022   Last hemoglobin A1c Lab Results  Component Value Date   HGBA1C 6.0 08/25/2022   Last thyroid functions Lab Results  Component Value Date   TSH 1.990 05/18/2022       Objective     Today's Vitals   08/25/22 0945  BP: 132/83  Pulse: 73  SpO2: 98%  Weight: 230 lb 6.4 oz (104.5 kg)  Height: 6' 2.2" (1.885 m)   Body mass index is 29.42 kg/m.  BP Readings from Last 3 Encounters:  08/25/22 132/83  05/25/22 (Abnormal) 144/88  04/26/22 (Abnormal) 182/90    Wt Readings from Last 3 Encounters:  08/25/22 230 lb 6.4 oz (104.5 kg)  05/25/22 224 lb 12.8 oz (102 kg)  04/25/22 230 lb (104.3 kg)    Physical Exam Vitals and nursing note reviewed.  Constitutional:      Appearance: Normal appearance. He is well-developed.  HENT:     Head: Normocephalic and atraumatic.     Nose: Nose normal.     Mouth/Throat:     Mouth: Mucous membranes are moist.     Pharynx: Oropharynx is clear.  Eyes:     Extraocular Movements: Extraocular movements intact.     Conjunctiva/sclera: Conjunctivae normal.     Pupils: Pupils are equal, round, and reactive to light.  Neck:     Vascular: No carotid bruit.  Cardiovascular:     Rate and Rhythm: Normal rate and regular rhythm.     Pulses: Normal pulses.     Heart sounds: Normal heart sounds.  Pulmonary:     Effort: Pulmonary effort is normal.     Breath sounds: Normal breath sounds.  Abdominal:     Palpations: Abdomen is soft.  Musculoskeletal:        General: Normal range of motion.     Cervical back: Normal range of  motion and neck supple.  Lymphadenopathy:     Cervical: No cervical adenopathy.  Skin:    General: Skin is warm and dry.     Capillary Refill: Capillary refill takes less than 2 seconds.  Neurological:     General: No focal deficit present.     Mental Status: He is alert and oriented to person, place, and time.  Psychiatric:        Mood and Affect: Mood normal.  Behavior: Behavior normal.        Thought Content: Thought content normal.        Judgment: Judgment normal.      Results for orders placed or performed in visit on 08/25/22  POCT glycosylated hemoglobin (Hb A1C)  Result Value Ref Range   Hemoglobin A1C     HbA1c POC (<> result, manual entry) 6.0 4.0 - 5.6 %   HbA1c, POC (prediabetic range)     HbA1c, POC (controlled diabetic range)      Assessment & Plan    1. Type 2 diabetes mellitus with hyperglycemia, without long-term current use of insulin (HCC) HgbA1c 6.0 today.  Continue diabetic medications as prescribed. - POCT glycosylated hemoglobin (Hb A1C)  2. Essential hypertension Stable.  Continue blood pressure medication as prescribed.  3. Mixed hyperlipidemia Continue Lipitor and Plavix as prescribed.  4. TIA (transient ischemic attack) Continue Plavix as prescribed.  5. Need for shingles vaccine Second shingles vaccine administered during today's visit. - Varicella-zoster vaccine IM    Problem List Items Addressed This Visit       Cardiovascular and Mediastinum   TIA (transient ischemic attack)   Essential hypertension     Endocrine   Type 2 diabetes mellitus with hyperglycemia, without long-term current use of insulin ([place]) - Primary   Relevant Orders   POCT glycosylated hemoglobin (Hb A1C) (Completed)     Other   HLD (hyperlipidemia)   Other Visit Diagnoses     Need for shingles vaccine       Relevant Orders   Varicella-zoster vaccine IM (Completed)        Return in about 3 months (around 11/25/2022) for diabetes with HgbA1c  check. needs urine microalbumin .         Ronnell Freshwater, NP  [facility] at [facility] [phone] (phone) [phone] (fax)  [place]

## 2022-10-18 ENCOUNTER — Telehealth: Payer: Self-pay

## 2022-10-18 ENCOUNTER — Other Ambulatory Visit: Payer: Self-pay | Admitting: Nurse Practitioner

## 2022-10-18 DIAGNOSIS — K219 Gastro-esophageal reflux disease without esophagitis: Secondary | ICD-10-CM

## 2022-10-18 NOTE — Telephone Encounter (Signed)
Pantoprazole has been approved by insurance. Pharmacy has been notified.

## 2022-11-21 ENCOUNTER — Other Ambulatory Visit: Payer: Self-pay | Admitting: Nurse Practitioner

## 2022-11-21 DIAGNOSIS — I10 Essential (primary) hypertension: Secondary | ICD-10-CM

## 2022-11-25 ENCOUNTER — Ambulatory Visit: Payer: 59 | Admitting: Nurse Practitioner

## 2022-12-23 ENCOUNTER — Other Ambulatory Visit: Payer: Self-pay | Admitting: Nurse Practitioner

## 2022-12-23 DIAGNOSIS — I10 Essential (primary) hypertension: Secondary | ICD-10-CM

## 2022-12-27 ENCOUNTER — Ambulatory Visit: Payer: 59 | Admitting: Nurse Practitioner

## 2023-01-23 ENCOUNTER — Other Ambulatory Visit: Payer: Self-pay | Admitting: Nurse Practitioner

## 2023-01-23 DIAGNOSIS — G459 Transient cerebral ischemic attack, unspecified: Secondary | ICD-10-CM

## 2023-01-23 DIAGNOSIS — E782 Mixed hyperlipidemia: Secondary | ICD-10-CM

## 2023-01-23 DIAGNOSIS — E1165 Type 2 diabetes mellitus with hyperglycemia: Secondary | ICD-10-CM

## 2023-01-24 ENCOUNTER — Ambulatory Visit: Payer: 59 | Admitting: Nurse Practitioner
